# Patient Record
Sex: Female | Born: 1955
Health system: Southern US, Community
[De-identification: ages and names within clinical notes are randomized; demographics above are authoritative.]

## PROBLEM LIST (undated history)

## (undated) DIAGNOSIS — I10 Essential (primary) hypertension: Secondary | ICD-10-CM

## (undated) DIAGNOSIS — D649 Anemia, unspecified: Secondary | ICD-10-CM

## (undated) DIAGNOSIS — E785 Hyperlipidemia, unspecified: Secondary | ICD-10-CM

## (undated) DIAGNOSIS — K648 Other hemorrhoids: Secondary | ICD-10-CM

## (undated) HISTORY — DX: Essential (primary) hypertension: I10

## (undated) HISTORY — DX: Other hemorrhoids: K64.8

## (undated) HISTORY — DX: Anemia, unspecified: D64.9

## (undated) HISTORY — DX: Hyperlipidemia, unspecified: E78.5

---

## 2002-06-16 ENCOUNTER — Encounter: Admission: RE | Admit: 2002-06-16 | Discharge: 2002-06-16 | Payer: Self-pay | Admitting: Internal Medicine

## 2002-06-30 ENCOUNTER — Encounter: Admission: RE | Admit: 2002-06-30 | Discharge: 2002-06-30 | Payer: Self-pay | Admitting: Internal Medicine

## 2002-09-10 ENCOUNTER — Encounter: Admission: RE | Admit: 2002-09-10 | Discharge: 2002-09-10 | Payer: Self-pay | Admitting: Internal Medicine

## 2002-10-07 ENCOUNTER — Encounter: Admission: RE | Admit: 2002-10-07 | Discharge: 2002-10-07 | Payer: Self-pay | Admitting: Internal Medicine

## 2002-12-31 ENCOUNTER — Encounter: Admission: RE | Admit: 2002-12-31 | Discharge: 2002-12-31 | Payer: Self-pay | Admitting: Internal Medicine

## 2003-10-26 ENCOUNTER — Encounter: Admission: RE | Admit: 2003-10-26 | Discharge: 2003-10-26 | Payer: Self-pay | Admitting: Internal Medicine

## 2005-08-09 ENCOUNTER — Ambulatory Visit: Payer: Self-pay | Admitting: Internal Medicine

## 2005-08-15 ENCOUNTER — Ambulatory Visit: Payer: Self-pay | Admitting: Internal Medicine

## 2005-09-05 ENCOUNTER — Ambulatory Visit: Payer: Self-pay | Admitting: Internal Medicine

## 2006-08-26 DIAGNOSIS — D649 Anemia, unspecified: Secondary | ICD-10-CM

## 2006-08-26 HISTORY — DX: Anemia, unspecified: D64.9

## 2006-09-25 ENCOUNTER — Telehealth: Payer: Self-pay | Admitting: *Deleted

## 2006-09-25 DIAGNOSIS — I10 Essential (primary) hypertension: Secondary | ICD-10-CM

## 2006-09-26 ENCOUNTER — Ambulatory Visit: Payer: Self-pay | Admitting: Internal Medicine

## 2006-09-26 ENCOUNTER — Encounter: Payer: Self-pay | Admitting: Internal Medicine

## 2006-09-26 LAB — CONVERTED CEMR LAB
Calcium: 9.7 mg/dL (ref 8.4–10.5)
Creatinine, Ser: 1.09 mg/dL (ref 0.40–1.20)

## 2006-10-09 ENCOUNTER — Telehealth (INDEPENDENT_AMBULATORY_CARE_PROVIDER_SITE_OTHER): Payer: Self-pay | Admitting: *Deleted

## 2006-10-22 ENCOUNTER — Telehealth (INDEPENDENT_AMBULATORY_CARE_PROVIDER_SITE_OTHER): Payer: Self-pay | Admitting: *Deleted

## 2007-02-16 ENCOUNTER — Telehealth: Payer: Self-pay | Admitting: *Deleted

## 2007-02-19 ENCOUNTER — Ambulatory Visit: Payer: Self-pay | Admitting: Internal Medicine

## 2007-02-19 ENCOUNTER — Encounter (INDEPENDENT_AMBULATORY_CARE_PROVIDER_SITE_OTHER): Payer: Self-pay | Admitting: Internal Medicine

## 2007-02-20 ENCOUNTER — Encounter (INDEPENDENT_AMBULATORY_CARE_PROVIDER_SITE_OTHER): Payer: Self-pay | Admitting: Internal Medicine

## 2007-02-20 DIAGNOSIS — D649 Anemia, unspecified: Secondary | ICD-10-CM

## 2007-02-20 LAB — CONVERTED CEMR LAB
ALT: 17 units/L (ref 0–35)
AST: 24 units/L (ref 0–37)
Albumin: 4.1 g/dL (ref 3.5–5.2)
BUN: 9 mg/dL (ref 6–23)
CO2: 25 meq/L (ref 19–32)
Chloride: 107 meq/L (ref 96–112)
Creatinine, Ser: 1.04 mg/dL (ref 0.40–1.20)
HCT: 31.6 % — ABNORMAL LOW (ref 36.0–46.0)
HDL: 58 mg/dL (ref >39)
Hemoglobin: 9.4 g/dL — ABNORMAL LOW (ref 12.0–15.0)
MCHC: 29.7 g/dL — ABNORMAL LOW (ref 30.0–36.0)
RDW: 19.1 % — ABNORMAL HIGH (ref 11.5–14.0)
Total Protein: 7.5 g/dL (ref 6.0–8.3)
VLDL: 16 mg/dL (ref 0–40)
WBC: 7 10*3/uL (ref 4.0–10.5)

## 2007-03-05 ENCOUNTER — Ambulatory Visit: Payer: Self-pay | Admitting: Internal Medicine

## 2007-03-05 ENCOUNTER — Encounter: Payer: Self-pay | Admitting: Internal Medicine

## 2007-03-05 DIAGNOSIS — E785 Hyperlipidemia, unspecified: Secondary | ICD-10-CM

## 2007-03-06 LAB — CONVERTED CEMR LAB
Basophils Absolute: 0 10*3/uL (ref 0.0–0.1)
Eosinophils Absolute: 0 10*3/uL (ref 0.0–0.7)
Eosinophils Relative: 0 % (ref 0–5)
Free T4: 1.14 ng/dL (ref 0.89–1.80)
HCT: 32.4 % — ABNORMAL LOW (ref 36.0–46.0)
Lymphs Abs: 1.3 10*3/uL (ref 0.7–3.3)
MCHC: 29.6 g/dL — ABNORMAL LOW (ref 30.0–36.0)
Platelets: 261 10*3/uL (ref 150–400)
RDW: 17.7 % — ABNORMAL HIGH (ref 11.5–14.0)
TSH: 1.402 microintl units/mL (ref 0.350–5.50)
WBC: 6.3 10*3/uL (ref 4.0–10.5)

## 2007-04-02 ENCOUNTER — Ambulatory Visit: Payer: Self-pay | Admitting: Internal Medicine

## 2007-04-02 ENCOUNTER — Encounter (INDEPENDENT_AMBULATORY_CARE_PROVIDER_SITE_OTHER): Payer: Self-pay | Admitting: Internal Medicine

## 2007-04-02 LAB — CONVERTED CEMR LAB
Ferritin: 20 ng/mL (ref 10–291)
Iron: 68 ug/dL (ref 42–145)
RBC Folate: 624 ng/mL — ABNORMAL HIGH (ref 180–600)
Saturation Ratios: 21 % (ref 20–55)
TIBC: 321 ug/dL (ref 250–470)
UIBC: 253 ug/dL

## 2007-04-09 LAB — CONVERTED CEMR LAB: OCCULT 2: NEGATIVE

## 2007-04-13 ENCOUNTER — Ambulatory Visit: Payer: Self-pay | Admitting: *Deleted

## 2007-05-13 ENCOUNTER — Ambulatory Visit: Payer: Self-pay | Admitting: Internal Medicine

## 2007-05-13 ENCOUNTER — Encounter (INDEPENDENT_AMBULATORY_CARE_PROVIDER_SITE_OTHER): Payer: Self-pay | Admitting: Internal Medicine

## 2007-05-13 LAB — CONVERTED CEMR LAB
BUN: 18 mg/dL (ref 6–23)
CO2: 26 meq/L (ref 19–32)
Calcium: 9.2 mg/dL (ref 8.4–10.5)
Chloride: 103 meq/L (ref 96–112)
Creatinine, Ser: 0.96 mg/dL (ref 0.40–1.20)
Glucose, Bld: 96 mg/dL (ref 70–99)
HCT: 39.8 % (ref 36.0–46.0)
Hemoglobin: 12.5 g/dL (ref 12.0–15.0)
MCHC: 31.4 g/dL (ref 30.0–36.0)
MCV: 82.7 fL (ref 78.0–100.0)
Platelets: 215 10*3/uL (ref 150–400)
Potassium: 4.1 meq/L (ref 3.5–5.3)
RBC: 4.81 M/uL (ref 3.87–5.11)
RDW: 20.2 % — ABNORMAL HIGH (ref 11.5–14.0)
Sodium: 140 meq/L (ref 135–145)
WBC: 3.9 10*3/uL — ABNORMAL LOW (ref 4.0–10.5)

## 2007-05-15 ENCOUNTER — Encounter (INDEPENDENT_AMBULATORY_CARE_PROVIDER_SITE_OTHER): Payer: Self-pay | Admitting: Internal Medicine

## 2007-05-22 ENCOUNTER — Ambulatory Visit (HOSPITAL_COMMUNITY): Admission: RE | Admit: 2007-05-22 | Discharge: 2007-05-22 | Payer: Self-pay | Admitting: Internal Medicine

## 2007-06-05 ENCOUNTER — Encounter (INDEPENDENT_AMBULATORY_CARE_PROVIDER_SITE_OTHER): Payer: Self-pay | Admitting: Internal Medicine

## 2007-06-05 LAB — HM MAMMOGRAPHY: HM Mammogram: NORMAL

## 2007-08-16 ENCOUNTER — Emergency Department (HOSPITAL_COMMUNITY): Admission: EM | Admit: 2007-08-16 | Discharge: 2007-08-16 | Payer: Self-pay | Admitting: Emergency Medicine

## 2008-10-31 ENCOUNTER — Telehealth (INDEPENDENT_AMBULATORY_CARE_PROVIDER_SITE_OTHER): Payer: Self-pay | Admitting: *Deleted

## 2008-11-04 ENCOUNTER — Encounter: Payer: Self-pay | Admitting: Internal Medicine

## 2008-11-04 ENCOUNTER — Encounter (INDEPENDENT_AMBULATORY_CARE_PROVIDER_SITE_OTHER): Payer: Self-pay | Admitting: Internal Medicine

## 2008-11-04 ENCOUNTER — Ambulatory Visit: Payer: Self-pay | Admitting: Internal Medicine

## 2008-11-04 LAB — CONVERTED CEMR LAB
ALT: 11 units/L (ref 0–35)
AST: 18 units/L (ref 0–37)
Albumin: 4.5 g/dL (ref 3.5–5.2)
Alkaline Phosphatase: 68 units/L (ref 39–117)
BUN: 16 mg/dL (ref 6–23)
CO2: 25 meq/L (ref 19–32)
Calcium: 9.6 mg/dL (ref 8.4–10.5)
Chloride: 104 meq/L (ref 96–112)
Creatinine, Ser: 1.08 mg/dL (ref 0.40–1.20)
MCHC: 31.2 g/dL (ref 30.0–36.0)
MCV: 86.6 fL (ref 78.0–100.0)
Platelets: 232 10*3/uL (ref 150–400)
Potassium: 4.3 meq/L (ref 3.5–5.3)
Total Protein: 7.7 g/dL (ref 6.0–8.3)

## 2008-11-09 ENCOUNTER — Encounter (INDEPENDENT_AMBULATORY_CARE_PROVIDER_SITE_OTHER): Payer: Self-pay | Admitting: Internal Medicine

## 2008-11-09 ENCOUNTER — Ambulatory Visit: Payer: Self-pay | Admitting: *Deleted

## 2008-11-10 ENCOUNTER — Telehealth: Payer: Self-pay | Admitting: *Deleted

## 2008-11-10 LAB — CONVERTED CEMR LAB
HDL: 60 mg/dL (ref >39)
VLDL: 11 mg/dL (ref 0–40)

## 2008-12-23 ENCOUNTER — Ambulatory Visit: Payer: Self-pay | Admitting: Internal Medicine

## 2008-12-23 ENCOUNTER — Encounter: Payer: Self-pay | Admitting: Internal Medicine

## 2008-12-23 LAB — HM PAP SMEAR: HM Pap smear: NEGATIVE

## 2008-12-29 ENCOUNTER — Ambulatory Visit: Payer: Self-pay | Admitting: Internal Medicine

## 2009-01-02 DIAGNOSIS — K921 Melena: Secondary | ICD-10-CM | POA: Insufficient documentation

## 2009-01-02 LAB — CONVERTED CEMR LAB
OCCULT 1: NEGATIVE
OCCULT 2: NEGATIVE

## 2009-12-05 ENCOUNTER — Telehealth: Payer: Self-pay | Admitting: Internal Medicine

## 2009-12-27 ENCOUNTER — Ambulatory Visit: Payer: Self-pay | Admitting: Internal Medicine

## 2009-12-27 DIAGNOSIS — K5900 Constipation, unspecified: Secondary | ICD-10-CM | POA: Insufficient documentation

## 2009-12-28 LAB — CONVERTED CEMR LAB
AST: 16 units/L (ref 0–37)
Albumin: 4.6 g/dL (ref 3.5–5.2)
BUN: 17 mg/dL (ref 6–23)
Calcium: 10.2 mg/dL (ref 8.4–10.5)
Glucose, Bld: 105 mg/dL — ABNORMAL HIGH (ref 70–99)
HCT: 41.5 % (ref 36.0–46.0)
Platelets: 234 10*3/uL (ref 150–400)
Potassium: 4.4 meq/L (ref 3.5–5.3)
Sodium: 139 meq/L (ref 135–145)
Total Protein: 8.3 g/dL (ref 6.0–8.3)
Triglycerides: 80 mg/dL (ref <150)
VLDL: 16 mg/dL (ref 0–40)
WBC: 4.9 10*3/uL (ref 4.0–10.5)

## 2010-01-03 ENCOUNTER — Ambulatory Visit: Payer: Self-pay | Admitting: Internal Medicine

## 2010-01-03 LAB — CONVERTED CEMR LAB: OCCULT 2: NEGATIVE

## 2010-01-24 ENCOUNTER — Inpatient Hospital Stay (HOSPITAL_COMMUNITY): Admission: EM | Admit: 2010-01-24 | Discharge: 2010-01-29 | Payer: Self-pay | Admitting: Emergency Medicine

## 2010-01-24 ENCOUNTER — Encounter: Payer: Self-pay | Admitting: Internal Medicine

## 2010-01-24 HISTORY — PX: ORIF TIBIA PLATEAU: SHX2132

## 2010-09-25 NOTE — Progress Notes (Signed)
Summary: Refill/gh  Phone Note Refill Request Message from:  Fax from Pharmacy on December 05, 2009 2:46 PM  Refills Requested: Medication #1:  LISINOPRIL 40 MG TABS Take 1 tablet by mouth once a day   Last Refilled: 11/03/2009  Medication #2:  HYDROCHLOROTHIAZIDE 25 MG TABS Take 1 tablet by mouth once a day   Last Refilled: 11/03/2009  Method Requested: Electronic Initial call taken by: Angelina Ok RN,  December 05, 2009 2:46 PM  Follow-up for Phone Call       Follow-up by: Blondell Reveal MD,  December 06, 2009 11:32 AM    Prescriptions: LISINOPRIL 40 MG TABS (LISINOPRIL) Take 1 tablet by mouth once a day  #30 x 11   Entered and Authorized by:   Blondell Reveal MD   Signed by:   Blondell Reveal MD on 12/06/2009   Method used:   Electronically to        [facility] #[id number]* (retail)       [address] [place], [place]  [address]       Ph: [phone]       Fax: [phone]   RxID:   [id number] HYDROCHLOROTHIAZIDE 25 MG TABS (HYDROCHLOROTHIAZIDE) Take 1 tablet by mouth once a day  #30 x 11   Entered and Authorized by:   Blondell Reveal MD   Signed by:   Blondell Reveal MD on 12/06/2009   Method used:   Electronically to        [facility] #[id number]* (retail)       [address] [place], [place]  [address]       Ph: [phone]       Fax: [phone]   RxID:   [id number]

## 2010-09-25 NOTE — Assessment & Plan Note (Signed)
Summary: est-ck/fu/meds/cfb   Vital Signs:  Patient profile:   55 year old female Height:      61 inches (154.94 cm) Weight:      171.0 pounds (77.73 kg) BMI:     32.43 Temp:     97.8 degrees F (36.56 degrees C) oral Pulse rate:   77 / minute BP sitting:   121 / 76  (right arm) Cuff size:   regular  Vitals Entered By: Theotis Barrio NT II (Dec 27, 2009 9:53 AM) CC: MEDICATION REFILL / ROUTINE OFFICE VISIT   / NEEDS SOMETHING TO HELP WITH BM- Is Patient Diabetic? No Pain Assessment Patient in pain? no      Nutritional Status BMI of > 30 = obese  Have you ever been in a relationship where you felt threatened, hurt or afraid?No   Does patient need assistance? Functional Status Self care Ambulation Normal Comments NEEDS SOMETHING TO HELP WITH BM  /ROUTINE OFFICE VISIT /  MEDICATION REFILL   Primary Care Provider:  Blondell Reveal MD  CC:  MEDICATION REFILL / ROUTINE OFFICE VISIT   / NEEDS SOMETHING TO HELP WITH BM-.  History of Present Illness: 55  y/o woman with PMH of  HTN, HLD comes to the office for a regular office visit. Patient reports hard stools recently for the last few months. Intermittent symptoms. She does have regular BM but reports hard stools. She denies any blood in the stool.   She denies any other complaints.  Preventive Screening-Counseling & Management  Alcohol-Tobacco     Alcohol type: wine occasioally     Smoking Status: never  Caffeine-Diet-Exercise     Does Patient Exercise: yes     Type of exercise: WALKING     Times/week: 3  Problems Prior to Update: 1)  Hemoccult Positive Stool  (ICD-578.1) 2)  Preventive Health Care  (ICD-V70.0) 3)  Hyperlipidemia  (ICD-272.4) 4)  Anemia Nos  (ICD-285.9) 5)  Health Maintenance Exam  (ICD-V70.0) 6)  Examination, Eyes/vision  (ICD-V72.0) 7)  Hypertension  (ICD-401.9)  Medications Prior to Update: 1)  Hydrochlorothiazide 25 Mg Tabs (Hydrochlorothiazide) .... Take 1 Tablet By Mouth Once A Day 2)   Lisinopril 40 Mg Tabs (Lisinopril) .... Take 1 Tablet By Mouth Once A Day 3)  Slow Fe 160 (50 Fe) Mg  Tbcr (Ferrous Sulfate Dried) .... Take 1 Tablet By Mouth Three Times A Day 4)  Pravastatin Sodium 40 Mg Tabs (Pravastatin Sodium) .... Take 1 Tablet By Mouth Once A Day  Current Medications (verified): 1)  Hydrochlorothiazide 25 Mg Tabs (Hydrochlorothiazide) .... Take 1 Tablet By Mouth Once A Day 2)  Lisinopril 40 Mg Tabs (Lisinopril) .... Take 1 Tablet By Mouth Once A Day 3)  Slow Fe 160 (50 Fe) Mg  Tbcr (Ferrous Sulfate Dried) .... Take 1 Tablet By Mouth Three Times A Day 4)  Pravastatin Sodium 40 Mg Tabs (Pravastatin Sodium) .... Take 1 Tablet By Mouth Once A Day  Allergies (verified): No Known Drug Allergies  Past History:  Family History: Last updated: 03/05/2007 Family History High cholesterol Family History Hypertension  Risk Factors: Exercise: yes (12/27/2009)  Past Medical History: Reviewed history from 03/05/2007 and no changes required. Hypertension Hyperlipidemia Anemia  Family History: Reviewed history from 03/05/2007 and no changes required. Family History High cholesterol Family History Hypertension  Social History: Reviewed history and no changes required. 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   Lungs:  Normal respiratory effort, chest expands symmetrically. Lungs are clear to auscultation, no crackles or wheezes. Heart:  Normal rate and regular rhythm. S1 and S2 normal without gallop, murmur, click, rub or other extra sounds. Abdomen:  Bowel sounds positive,abdomen soft and non-tender without masses, organomegaly or hernias noted. Pulses:  R radial normal.   Extremities:  no  edema Neurologic:  alert & oriented X3, strength normal in all extremities, and gait normal.     Impression & Recommendations:  Problem # 1:  HYPERLIPIDEMIA (ICD-272.4)  Will check FLP today. Cont statins for now.  Her updated medication list for this problem includes:    Pravastatin Sodium 40 Mg Tabs (Pravastatin sodium) .Marland Kitchen... Take 1 tablet by mouth once a day  Orders: T-Hgb A1C (in-house) ([id number]) T-Comprehensive Metabolic Panel ([id number]) T-CBC No Diff ([id number]) T-Hemoccult Card-Multiple (take home) (73220) T-Lipid Profile (25427-06237)  Problem # 2:  ANEMIA NOS (ICD-285.9)  Hb was improving. Given her age and history of FOBT+, will refer to GI for colonoscopy.  Her updated medication list for this problem includes:    Slow Fe 160 (50 Fe) Mg Tbcr (Ferrous sulfate dried) .Marland Kitchen... Take 1 tablet by mouth three times a day  Orders: T-Hgb A1C (in-house) ([id number]) T-Comprehensive Metabolic Panel ([id number]) T-CBC No Diff ([id number]) T-Hemoccult Card-Multiple (take home) (70350) T-Lipid Profile (09381-82993)  Problem # 3:  HYPERTENSION (ICD-401.9)  BP well controlled. Continue current regimen.  Her updated medication list for this problem includes:    Hydrochlorothiazide 25 Mg Tabs (Hydrochlorothiazide) .Marland Kitchen... Take 1 tablet by mouth once a day    Lisinopril 40 Mg Tabs (Lisinopril) .Marland Kitchen... Take 1 tablet by mouth once a day  Orders: T-Hgb A1C (in-house) ([id number]) T-Comprehensive Metabolic Panel ([id number]) T-CBC No Diff ([id number]) T-Hemoccult Card-Multiple (take home) (23536) T-Lipid Profile (14431-54008)  Problem # 4:  PREVENTIVE HEALTH CARE (ICD-V70.0) Will get a Screening Colonoscopy.  Orders: Gastroenterology Referral (GI) T-Hgb A1C (in-house) ([id number]) T-Comprehensive Metabolic Panel [phone]) T-CBC No Diff ([id number]) T-Hemoccult Card-Multiple (take home) (53976) T-Lipid Profile (73419-37902)  Problem # 5:  CONSTIPATION, INTERMITTENT  (ICD-564.00)  C/O Hard stools and occasional constipation. Recommended life style modification, exercise, increased water intake, fruits and vegetables. Will start her on Colace.   Her updated medication list for this problem includes:    Docusate Sodium 100 Mg Caps (Docusate sodium) .Marland Kitchen... Take 1 tablet by mouth two times a day as needed for constipation.  Complete Medication List: 1)  Hydrochlorothiazide 25 Mg Tabs (Hydrochlorothiazide) .... Take 1 tablet by mouth once a day 2)  Lisinopril 40 Mg Tabs (Lisinopril) .... Take 1 tablet by mouth once a day 3)  Slow Fe 160 (50 Fe) Mg Tbcr (Ferrous sulfate dried) .... Take 1 tablet by mouth three times a day 4)  Pravastatin Sodium 40 Mg Tabs (Pravastatin sodium) .... Take 1 tablet by mouth once a day 5)  Docusate Sodium 100 Mg Caps (Docusate sodium) .... Take 1 tablet by mouth two times a day as needed for constipation.  Patient Instructions: 1)  Please schedule a follow-up appointment in 6 months. 2)  Take all the medications as advised below. 3)  We will schedule you for colonoscopy and will inform you. Prescriptions: SLOW FE 160 (50 FE) MG  TBCR (FERROUS SULFATE DRIED) Take 1 tablet by mouth three times a day  #90 x 6   Entered and  Authorized by:   Blondell Reveal MD   Signed by:   Blondell Reveal MD on 12/27/2009   Method used:   Print then Give to Patient   RxID:   [id number] LISINOPRIL 40 MG TABS (LISINOPRIL) Take 1 tablet by mouth once a day  #30 x 11   Entered and Authorized by:   Blondell Reveal MD   Signed by:   Blondell Reveal MD on 12/27/2009   Method used:   Print then Give to Patient   RxID:   [id number] HYDROCHLOROTHIAZIDE 25 MG TABS (HYDROCHLOROTHIAZIDE) Take 1 tablet by mouth once a day  #30 x 11   Entered and Authorized by:   Blondell Reveal MD   Signed by:   Blondell Reveal MD on 12/27/2009   Method used:   Print then Give to Patient   RxID:   [id number] DOCUSATE SODIUM 100 MG CAPS (DOCUSATE SODIUM) Take 1 tablet  by mouth two times a day as needed for constipation.  #60 x 2   Entered and Authorized by:   Blondell Reveal MD   Signed by:   Blondell Reveal MD on 12/27/2009   Method used:   Print then Give to Patient   RxID:   [id number]  Process Orders Check Orders Results:     [facility]: ABN not required for this insurance Tests Sent for requisitioning (Dec 27, 2009 10:56 AM):     12/27/2009: [facility] -- T-Comprehensive Metabolic Panel [[id number]] (signed)     12/27/2009: [facility] -- T-CBC No Diff [[id number]] (signed)     12/27/2009: [facility] -- T-Lipid Profile [phone] (signed)    Prevention & Chronic Care Immunizations   Influenza vaccine: Not documented   Influenza vaccine deferral: Not indicated  (12/27/2009)    Tetanus booster: Not documented   Td booster deferral: Refused  (12/27/2009)    Pneumococcal vaccine: Not documented  Colorectal Screening   Hemoccult: Not documented    Colonoscopy: Not documented  Other Screening   Pap smear: NEGATIVE FOR INTRAEPITHELIAL LESIONS OR MALIGNANCY.  (12/23/2008)   Pap smear action/deferral: Deferred-2 yr interval  (12/27/2009)   Pap smear due: 05/2008    Mammogram: normal  (06/05/2007)   Mammogram action/deferral: Refused  (12/27/2009)   Mammogram due: 05/2008   Smoking status: never  (12/27/2009)    Screening comments: patient deferred mammogram to some other time  Lipids   Total Cholesterol: 264  (11/09/2008)   LDL: 193  (11/09/2008)   LDL Direct: Not documented   HDL: 60  (11/09/2008)   Triglycerides: 56  (11/09/2008)    SGOT (AST): 18  (11/04/2008)   SGPT (ALT): 11  (11/04/2008) CMP ordered    Alkaline phosphatase: 68  (11/04/2008)   Total bilirubin: 0.3  (11/04/2008)  Hypertension   Last Blood Pressure: 121 / 76  (12/27/2009)   Serum creatinine: 1.08  (11/04/2008)   Serum potassium 4.3  (11/04/2008) CMP ordered   Self-Management  Support :   Personal Goals (by the next clinic visit) :      Personal blood pressure goal: 140/90  (12/27/2009)     Personal LDL goal: 100  (12/27/2009)    Patient will work on the following items until the next clinic visit to reach self-care goals:     Medications and monitoring: take my medicines every day, bring all of my medications to every visit  (12/27/2009)     Eating: drink diet soda or water instead of juice or soda, eat more vegetables, use  fresh or frozen vegetables, eat foods that are low in salt, eat baked foods instead of fried foods, eat fruit for snacks and desserts, limit or avoid alcohol  (12/27/2009)     Activity: take a 30 minute walk every day  (12/27/2009)    Hypertension self-management support: Resources for patients handout  (12/27/2009)    Lipid self-management support: Resources for patients handout  (12/27/2009)        Resource handout printed.  Appended Document: Results of HGB A1C    Lab Visit  Laboratory Results   Blood Tests   Date/Time Received: Dec 27, 2009 11:52 AM  Date/Time Reported: Burke Keels  Dec 27, 2009 11:52 AM   HGBA1C: 5.4%   (Normal Range: Non-Diabetic - 3-6%   Control Diabetic - 6-8%)    Orders Today:

## 2010-09-25 NOTE — Miscellaneous (Signed)
Summary: ER visit.  Patient come the ER after sustaining an injury on her right knee after falling when running. She denies any CP, palpitations, dizziness, fever, chills or any other complaints that could contribute to her fall. She just trip and fall. Patient had a x-ray showing a depressed medial fracture of her right tibial plateau; she is going to follow by orthopedics on Friday for this fracture and will received imbolization of her knee meanwhile.  Once in the ED patient CR was up to 1.5 initially and her BP was soft; her orthostatic vital signs were negative. she was rehydrated , subsequent Cr was back to normal to 1.04; BP meds has been hold until next monday, when she is going to be evaluated at the clinic for followup. She will need at BMET to follow on her renal function and will also need to have her BPmeds restarted/adjusted as needed.

## 2010-11-04 ENCOUNTER — Encounter: Payer: Self-pay | Admitting: Ophthalmology

## 2010-11-12 LAB — BASIC METABOLIC PANEL
CO2: 25 mEq/L (ref 19–32)
Calcium: 8.7 mg/dL (ref 8.4–10.5)
GFR calc Af Amer: >60 mL/min (ref >60)
GFR calc non Af Amer: 56 mL/min — ABNORMAL LOW (ref >60)
GFR calc non Af Amer: >60 mL/min (ref >60)
Potassium: 3.2 mEq/L — ABNORMAL LOW (ref 3.5–5.1)
Sodium: 138 mEq/L (ref 135–145)
Sodium: 138 mEq/L (ref 135–145)

## 2010-11-12 LAB — CBC
HCT: 32.4 % — ABNORMAL LOW (ref 36.0–46.0)
Hemoglobin: 11.3 g/dL — ABNORMAL LOW (ref 12.0–15.0)
Hemoglobin: 12.5 g/dL (ref 12.0–15.0)
MCV: 85.3 fL (ref 78.0–100.0)
Platelets: 164 10*3/uL (ref 150–400)
RBC: 3.8 MIL/uL — ABNORMAL LOW (ref 3.87–5.11)
WBC: 5.8 10*3/uL (ref 4.0–10.5)
WBC: 6.2 10*3/uL (ref 4.0–10.5)

## 2010-11-12 LAB — URINE CULTURE

## 2010-11-12 LAB — DIFFERENTIAL
Eosinophils Relative: 0 % (ref 0–5)
Lymphocytes Relative: 28 % (ref 12–46)
Lymphs Abs: 1.7 10*3/uL (ref 0.7–4.0)
Monocytes Absolute: 0.6 10*3/uL (ref 0.1–1.0)
Monocytes Relative: 9 % (ref 3–12)
Neutro Abs: 3.9 10*3/uL (ref 1.7–7.7)

## 2010-11-12 LAB — POCT I-STAT, CHEM 8
Creatinine, Ser: 1.5 mg/dL — ABNORMAL HIGH (ref 0.4–1.2)
HCT: 40 % (ref 36.0–46.0)
Potassium: 4 mEq/L (ref 3.5–5.1)
TCO2: 23 mmol/L (ref 0–100)

## 2010-11-12 LAB — URINALYSIS, ROUTINE W REFLEX MICROSCOPIC
Bilirubin Urine: NEGATIVE
Ketones, ur: NEGATIVE mg/dL
Nitrite: POSITIVE — AB
Specific Gravity, Urine: 1.018 (ref 1.005–1.030)
Urobilinogen, UA: 1 mg/dL (ref 0.0–1.0)

## 2010-11-12 LAB — URINE MICROSCOPIC-ADD ON

## 2010-11-20 ENCOUNTER — Other Ambulatory Visit: Payer: Self-pay | Admitting: *Deleted

## 2010-11-20 MED ORDER — PRAVASTATIN SODIUM 40 MG PO TABS: 40.0000 mg | ORAL_TABLET | Freq: Every day | ORAL | Status: DC

## 2010-11-20 NOTE — Telephone Encounter (Signed)
Last seen 12/27/09. Sent to front desk to sch routine F/U

## 2010-11-20 NOTE — Telephone Encounter (Signed)
No pharmacy.  How do I refill this?

## 2010-12-06 ENCOUNTER — Other Ambulatory Visit (HOSPITAL_COMMUNITY)
Admission: RE | Admit: 2010-12-06 | Discharge: 2010-12-06 | Disposition: A | Discharge status: Home / Self Care | Payer: Self-pay | Source: Ambulatory Visit | Attending: Internal Medicine | Admitting: Internal Medicine

## 2010-12-06 ENCOUNTER — Ambulatory Visit (INDEPENDENT_AMBULATORY_CARE_PROVIDER_SITE_OTHER): Payer: Self-pay | Admitting: Internal Medicine

## 2010-12-06 ENCOUNTER — Encounter: Payer: Self-pay | Admitting: Internal Medicine

## 2010-12-06 VITALS — BP 138/83 | HR 71 | Temp 97.2°F | Ht 63.0 in | Wt 173.9 lb

## 2010-12-06 DIAGNOSIS — E785 Hyperlipidemia, unspecified: Secondary | ICD-10-CM

## 2010-12-06 DIAGNOSIS — Z01419 Encounter for gynecological examination (general) (routine) without abnormal findings: Secondary | ICD-10-CM | POA: Insufficient documentation

## 2010-12-06 DIAGNOSIS — Z124 Encounter for screening for malignant neoplasm of cervix: Secondary | ICD-10-CM

## 2010-12-06 DIAGNOSIS — Z0000 Encounter for general adult medical examination without abnormal findings: Secondary | ICD-10-CM

## 2010-12-06 DIAGNOSIS — I10 Essential (primary) hypertension: Secondary | ICD-10-CM

## 2010-12-06 DIAGNOSIS — D649 Anemia, unspecified: Secondary | ICD-10-CM

## 2010-12-06 DIAGNOSIS — N898 Other specified noninflammatory disorders of vagina: Secondary | ICD-10-CM

## 2010-12-06 NOTE — Patient Instructions (Addendum)
1) Please follow-up at the clinic in 1 month, at which time we will reevaluate your blood pressure, cholesterol. 2) Please bring all of your medications in a bag to your next visit. 3) Please come back for labs next week, please be fasting (no food/ drink x 8 hours).

## 2010-12-07 ENCOUNTER — Telehealth: Payer: Self-pay | Admitting: Internal Medicine

## 2010-12-07 ENCOUNTER — Encounter: Payer: Self-pay | Admitting: Internal Medicine

## 2010-12-07 DIAGNOSIS — Z0000 Encounter for general adult medical examination without abnormal findings: Secondary | ICD-10-CM | POA: Insufficient documentation

## 2010-12-07 DIAGNOSIS — B9689 Other specified bacterial agents as the cause of diseases classified elsewhere: Secondary | ICD-10-CM | POA: Insufficient documentation

## 2010-12-07 DIAGNOSIS — N760 Acute vaginitis: Secondary | ICD-10-CM | POA: Insufficient documentation

## 2010-12-07 DIAGNOSIS — N898 Other specified noninflammatory disorders of vagina: Secondary | ICD-10-CM | POA: Insufficient documentation

## 2010-12-07 LAB — WET PREP BY MOLECULAR PROBE
Candida species: NEGATIVE
Trichomonas vaginosis: NEGATIVE

## 2010-12-07 MED ORDER — METRONIDAZOLE 500 MG PO TABS: 500.0000 mg | ORAL_TABLET | Freq: Two times a day (BID) | ORAL | Status: AC

## 2010-12-07 NOTE — Assessment & Plan Note (Signed)
Well controlled. Continue current regimen.  - Check CMET

## 2010-12-07 NOTE — Assessment & Plan Note (Addendum)
-   Needs mammogram - pt elects to follow up with [facility] to check current insurance status, aware she needs to get this done. - Needs Tdap - PAP today - Labs in 1 week when pt fasting., will do tdap at that time - Needs colonoscopy - pt elects to follow up with [facility] to check current insurance status, aware she needs to get this done.

## 2010-12-07 NOTE — Assessment & Plan Note (Signed)
PAP with wet prep, gc/ chlamydia today. - Will call if abn results.

## 2010-12-07 NOTE — Progress Notes (Signed)
  Subjective:    Patient ID: Miranda Griffin, female    DOB: 1956-08-02, 55 y.o.   MRN: [id number]  HPI Patient is a 55 y.o. female with a PMHx of HTN, HLD, anemia, who presents today for the following:   1) PAP smear - patient indicates that she is currently sexually active, with one female partner. Does not use contraception. Patient confirms recent increased mild discharge, without dysuria, hematuria, malodorous urine, no dyspareunia, fevers, chills, back pain. Does not douche. No external lesions, vaginal itching experienced.   2) HTN - Patient does not check blood pressure regularly at home. Currently taking Lisinopril 40mg  and HCTZ 25mg  daily. Denies missing doses. Denies headaches, dizziness, lightheadedness, chest pain, shortness of breath.   3) Preventative Health Care - Patient has history of FOBT (+), with multiple prior GI referrals. However, pt states she is unsure the status of her Medicaid, and is unsure where she stands in regards to [employer]. She is in need of colonoscopy. Denies recent noticeable blood in stools, weakness, lightheadedness. Patient also due for mammogram, which she again states she plans to get after insurance situation is clarified.   Review of Systems Per HPI.   Current Outpatient Medications Medication Sig  . hydrochlorothiazide 25 MG tablet Take 25 mg by mouth daily.    Marland Kitchen lisinopril (PRINIVIL,ZESTRIL) 40 MG tablet Take 40 mg by mouth daily.    . pravastatin (PRAVACHOL) 40 MG tablet Take 1 tablet (40 mg total) by mouth daily.   Allergies Review of patient's allergies indicates no known allergies.  Past Medical History  Diagnosis Date  . Hypertension   . Hyperlipidemia   . Anemia 2008    Now resolved, baseline- 12.5  . Heme positive stool 5/10    GI referral made, No note in EMR, most recent stool heme neg in  5/11    Past Surgical History  Procedure Date  . Knee surgery   . Cesarean section     x2, 1989, 1990       Objective:   Physical  Exam General: Vital signs reviewed and noted. Well-developed, well-nourished, in no acute distress; alert, appropriate and cooperative throughout examination.  Head: Normocephalic, atraumatic.  Neck: No deformities, masses, or tenderness noted.  Lungs:  Normal respiratory effort. Clear to auscultation BL without crackles or wheezes.  Heart: RRR. S1 and S2 normal without gallop, murmur, or rubs.  Abdomen:  BS normoactive. Soft, Nondistended, non-tender.  No masses or organomegaly.  Extremities: No pretibial edema.  Pelvic Exam: No external vaginal lesions appreciated. Vaginal vault with thin, creamy discharge, nonmalodorous. Cervix without noticeable lesions, nonfriable.No cervical motion tenderness.       Assessment & Plan:  Case and plan of care discussed with Dr. Doneen Poisson.

## 2010-12-07 NOTE — Assessment & Plan Note (Addendum)
Patient was called and informed that her recent wet prep showed that she has bacterial vaginosis, consistent with the discharge observed during exam and the discharge of which the patient complains. She will therefore, be started on Flagyl 500mg  BID x 7 days, which she is counseled not to talk with alcohol. She is instructed to complete all antibiotics as prescribed, denies prior adverse reaction to this medication. Instructed that if she has throat closing, rash, tongue swelling, stop medication immediately and return to clinic or to go to ER. - All questions were addressed, and patient is to call clinic if additional questions arise.

## 2010-12-07 NOTE — Telephone Encounter (Signed)
See A&P per BV problem above.

## 2010-12-07 NOTE — Assessment & Plan Note (Signed)
Check CMET, FLP - Pt to come next week for evaluation as she is not fasting today.

## 2010-12-07 NOTE — Assessment & Plan Note (Signed)
GI referral for colonoscopy once patient reestablishes insurance status.  - To meet with Rudell Cobb next week when she comes for labs.

## 2010-12-07 NOTE — Assessment & Plan Note (Signed)
-   Check CBC, ferritin, pt not on ferrous sulfate currently. - GI referral for colonoscopy.

## 2010-12-13 ENCOUNTER — Ambulatory Visit (INDEPENDENT_AMBULATORY_CARE_PROVIDER_SITE_OTHER): Payer: Self-pay | Admitting: *Deleted

## 2010-12-13 ENCOUNTER — Other Ambulatory Visit: Payer: Self-pay

## 2010-12-13 DIAGNOSIS — Z23 Encounter for immunization: Secondary | ICD-10-CM

## 2010-12-13 DIAGNOSIS — Z0000 Encounter for general adult medical examination without abnormal findings: Secondary | ICD-10-CM

## 2010-12-13 DIAGNOSIS — E785 Hyperlipidemia, unspecified: Secondary | ICD-10-CM

## 2010-12-13 DIAGNOSIS — D649 Anemia, unspecified: Secondary | ICD-10-CM

## 2010-12-13 DIAGNOSIS — I10 Essential (primary) hypertension: Secondary | ICD-10-CM

## 2010-12-13 LAB — COMPREHENSIVE METABOLIC PANEL
CO2: 27 mEq/L (ref 19–32)
Calcium: 10.1 mg/dL (ref 8.4–10.5)
Chloride: 103 mEq/L (ref 96–112)
Glucose, Bld: 102 mg/dL — ABNORMAL HIGH (ref 70–99)
Sodium: 139 mEq/L (ref 135–145)
Total Bilirubin: 0.3 mg/dL (ref 0.3–1.2)
Total Protein: 7.9 g/dL (ref 6.0–8.3)

## 2010-12-13 LAB — LIPID PANEL
Cholesterol: 192 mg/dL (ref 0–200)
Triglycerides: 48 mg/dL (ref <150)

## 2010-12-14 LAB — CBC
Hemoglobin: 12.2 g/dL (ref 12.0–15.0)
MCH: 27.7 pg (ref 26.0–34.0)
MCV: 86.8 fL (ref 78.0–100.0)
RBC: 4.4 MIL/uL (ref 3.87–5.11)

## 2010-12-20 ENCOUNTER — Other Ambulatory Visit: Payer: Self-pay

## 2011-02-19 ENCOUNTER — Other Ambulatory Visit: Payer: Self-pay | Admitting: Internal Medicine

## 2011-02-21 ENCOUNTER — Ambulatory Visit (INDEPENDENT_AMBULATORY_CARE_PROVIDER_SITE_OTHER): Payer: Self-pay | Admitting: Internal Medicine

## 2011-02-21 ENCOUNTER — Encounter: Payer: Self-pay | Admitting: Internal Medicine

## 2011-02-21 VITALS — BP 125/78 | HR 77 | Temp 98.0°F | Ht 63.0 in | Wt 173.1 lb

## 2011-02-21 DIAGNOSIS — B9689 Other specified bacterial agents as the cause of diseases classified elsewhere: Secondary | ICD-10-CM

## 2011-02-21 DIAGNOSIS — Z1231 Encounter for screening mammogram for malignant neoplasm of breast: Secondary | ICD-10-CM

## 2011-02-21 DIAGNOSIS — E785 Hyperlipidemia, unspecified: Secondary | ICD-10-CM

## 2011-02-21 DIAGNOSIS — N760 Acute vaginitis: Secondary | ICD-10-CM

## 2011-02-21 DIAGNOSIS — Z0000 Encounter for general adult medical examination without abnormal findings: Secondary | ICD-10-CM

## 2011-02-21 DIAGNOSIS — A499 Bacterial infection, unspecified: Secondary | ICD-10-CM

## 2011-02-21 DIAGNOSIS — I10 Essential (primary) hypertension: Secondary | ICD-10-CM

## 2011-02-21 NOTE — Patient Instructions (Signed)
   Please follow-up at the clinic in 3-6 months, at which time we will reevaluate your blood pressure.  If symptoms worsen, or new symptoms arise, please call the clinic or go to the ER.  We will set you up for a colonoscopy.  We will set you up for a mammogram.   Please bring all of your medications in a bag to your next visit.

## 2011-02-22 ENCOUNTER — Ambulatory Visit (HOSPITAL_COMMUNITY)
Admission: RE | Admit: 2011-02-22 | Discharge: 2011-02-22 | Disposition: A | Discharge status: Home / Self Care | Payer: Self-pay | Source: Ambulatory Visit | Attending: Internal Medicine | Admitting: Internal Medicine

## 2011-02-22 DIAGNOSIS — Z1231 Encounter for screening mammogram for malignant neoplasm of breast: Secondary | ICD-10-CM | POA: Insufficient documentation

## 2011-02-28 ENCOUNTER — Telehealth: Payer: Self-pay | Admitting: *Deleted

## 2011-02-28 NOTE — Telephone Encounter (Signed)
CALLED AND SPOKE WITH  Miranda Griffin, GAVE HER THE APPT INFO. FOR HER COLONOSCOPY.  [facility] - DR BRODIE - [id number] NURSE VISIT:JULY 12, 012 / PROCEDURE : July 26.012 2:30PM TO  ARRIVE 1:30PM. ALSO  TOLD PATIENT THAT A APPT REMINDER LETTER HAS BEEN MAILED OUT TO HER WITH THIS INFORMATION. Miranda Griffin NTII  7/4/012 / 8:44AM.

## 2011-03-07 ENCOUNTER — Encounter: Payer: Self-pay | Admitting: Internal Medicine

## 2011-03-07 NOTE — Assessment & Plan Note (Addendum)
Patient treated for bacterial vaginosis during last clinic visit, she has since had resolution of the vaginal discharge. I provided an extensive counseling to the patient regarding bacterial vaginosis specifically recommended using condoms regularly when engaging in sexual activity, avoiding douching, regularly cleansing and drying vaginal region.

## 2011-03-07 NOTE — Assessment & Plan Note (Signed)
-   Schedule for colonoscopy. - Schedule for mammogram.

## 2011-03-07 NOTE — Assessment & Plan Note (Signed)
Recent fasting lipid panel indicates LDL of 123, indicating adequate lipid management on current regimen. Well controlled. Continue current regimen.

## 2011-03-07 NOTE — Progress Notes (Signed)
  Subjective:    Patient ID: Miranda Griffin, female    DOB: 1955/10/27, 55 y.o.   MRN: [id number]  HPI Pt is a 55 y.o. female who has a past medical history of HTN; HLD; Anemia (2008) and presents to clinic today for the following:   1) HTN - Patient does not check blood pressure regularly at home. Currently taking Lisinopril 40mg  and HCTZ 25mg  daily. Denies missing doses. Denies headaches, dizziness, lightheadedness, chest pain, shortness of breath.   2) Follow-up of bacterial vaginosis - patient was noted to have bacterial vaginosis per wet prep in 11/2010, and was subsequently treated with Flagyl. Patient indicates that she completed therapy without incident. Has since remained without discharge, pruritis, vaginal lesions. No fevers, chills, dysuria, hematuria. Patient does have several questions today about how to avoid future episodes, and if it is safe to continue to be sexually active.   3) Preventative Health Care - Patient has history of FOBT (+), with multiple prior GI referrals. However, previously she was not following up as recommended because of lack of insurance. Has since qualified for orange card. She is in need of colonoscopy. Denies recent noticeable blood in stools, weakness, lightheadedness. Patient also due for mammogram.  4) HLD - currently taking pravastatin 40mg  daily. Had recent FLP showing LDL level of 123, therefore, no further adjustments were made to anti-lipid medications. Denies chest pain, difficulty breathing, palpitations, tachycardia, and muscle pains.     Review of Systems Per HPI.     Objective:   Physical Exam General: Vital signs reviewed and noted. Well-developed, well-nourished, in no acute distress; alert, appropriate and cooperative throughout examination.  Head: Normocephalic, atraumatic.  Neck: No deformities, masses, or tenderness noted.  Lungs:  Normal respiratory effort. Clear to auscultation BL without crackles or wheezes.  Heart: RRR. S1 and  S2 normal without gallop, murmur, or rubs.  Abdomen:  BS normoactive. Soft, Nondistended, non-tender.  No masses or organomegaly.  Extremities: No pretibial edema.         Assessment & Plan:  Case and plan of care discussed with Dr. Anderson Malta.

## 2011-03-07 NOTE — Assessment & Plan Note (Signed)
Well-controlled.  Continue current regimen. 

## 2011-03-08 ENCOUNTER — Ambulatory Visit (AMBULATORY_SURGERY_CENTER): Payer: Self-pay | Admitting: *Deleted

## 2011-03-08 VITALS — Ht 63.0 in | Wt 170.0 lb

## 2011-03-08 DIAGNOSIS — Z1211 Encounter for screening for malignant neoplasm of colon: Secondary | ICD-10-CM

## 2011-03-08 MED ORDER — PEG-KCL-NACL-NASULF-NA ASC-C 100 G PO SOLR: ORAL | Status: DC

## 2011-03-18 ENCOUNTER — Other Ambulatory Visit: Payer: Self-pay | Admitting: *Deleted

## 2011-03-18 MED ORDER — HYDROCHLOROTHIAZIDE 25 MG PO TABS: 25.0000 mg | ORAL_TABLET | Freq: Every day | ORAL | Status: DC

## 2011-03-18 MED ORDER — LISINOPRIL 40 MG PO TABS: 40.0000 mg | ORAL_TABLET | Freq: Every day | ORAL | Status: DC

## 2011-03-21 ENCOUNTER — Encounter: Payer: Self-pay | Admitting: Internal Medicine

## 2011-03-21 ENCOUNTER — Ambulatory Visit (AMBULATORY_SURGERY_CENTER): Payer: Self-pay | Admitting: Internal Medicine

## 2011-03-21 VITALS — BP 129/57 | HR 63 | Temp 97.7°F | Resp 14 | Ht 63.0 in | Wt 170.0 lb

## 2011-03-21 DIAGNOSIS — D649 Anemia, unspecified: Secondary | ICD-10-CM

## 2011-03-21 DIAGNOSIS — K648 Other hemorrhoids: Secondary | ICD-10-CM

## 2011-03-21 DIAGNOSIS — K921 Melena: Secondary | ICD-10-CM

## 2011-03-21 DIAGNOSIS — Z1211 Encounter for screening for malignant neoplasm of colon: Secondary | ICD-10-CM

## 2011-03-21 MED ORDER — SODIUM CHLORIDE 0.9 % IV SOLN: 500.0000 mL | INTRAVENOUS | Status: DC

## 2011-03-21 NOTE — Progress Notes (Signed)
Pt tolerated colon exam very well. MAW

## 2011-03-21 NOTE — Patient Instructions (Signed)
Discharge instructions given with verbal understanding.  Handouts on hemorrhoids and a high fiber diet given.  Resume previous medications. 

## 2011-03-22 ENCOUNTER — Telehealth: Payer: Self-pay

## 2011-03-22 NOTE — Telephone Encounter (Signed)

## 2011-05-05 IMAGING — CR DG CHEST 2V
2 series · 2 of 2 positions shown · non-contrast
Comparison: None

CLINICAL DATA: Dizziness, hypertension

CHEST - 2 VIEW

[w chest pa]
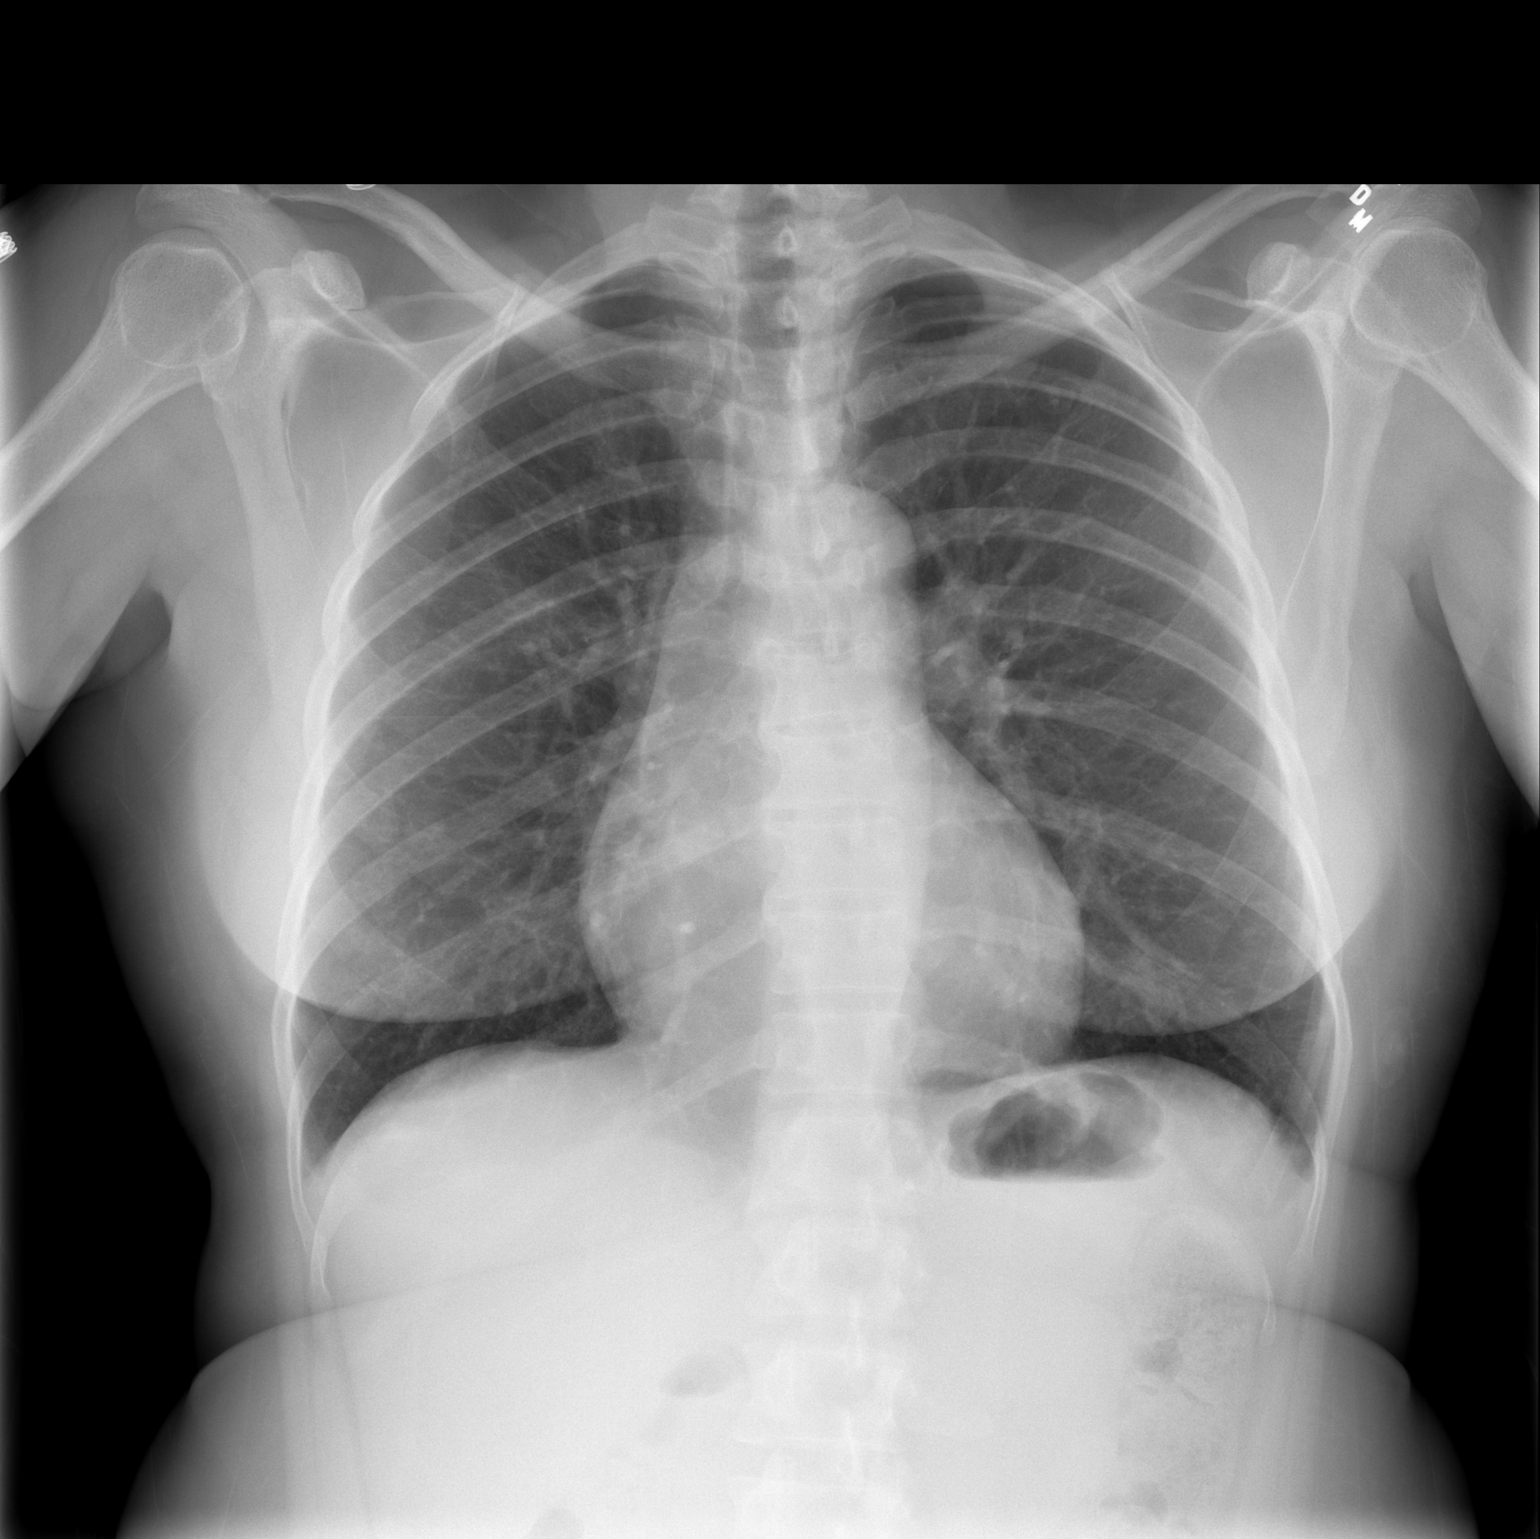

[w chest lat]
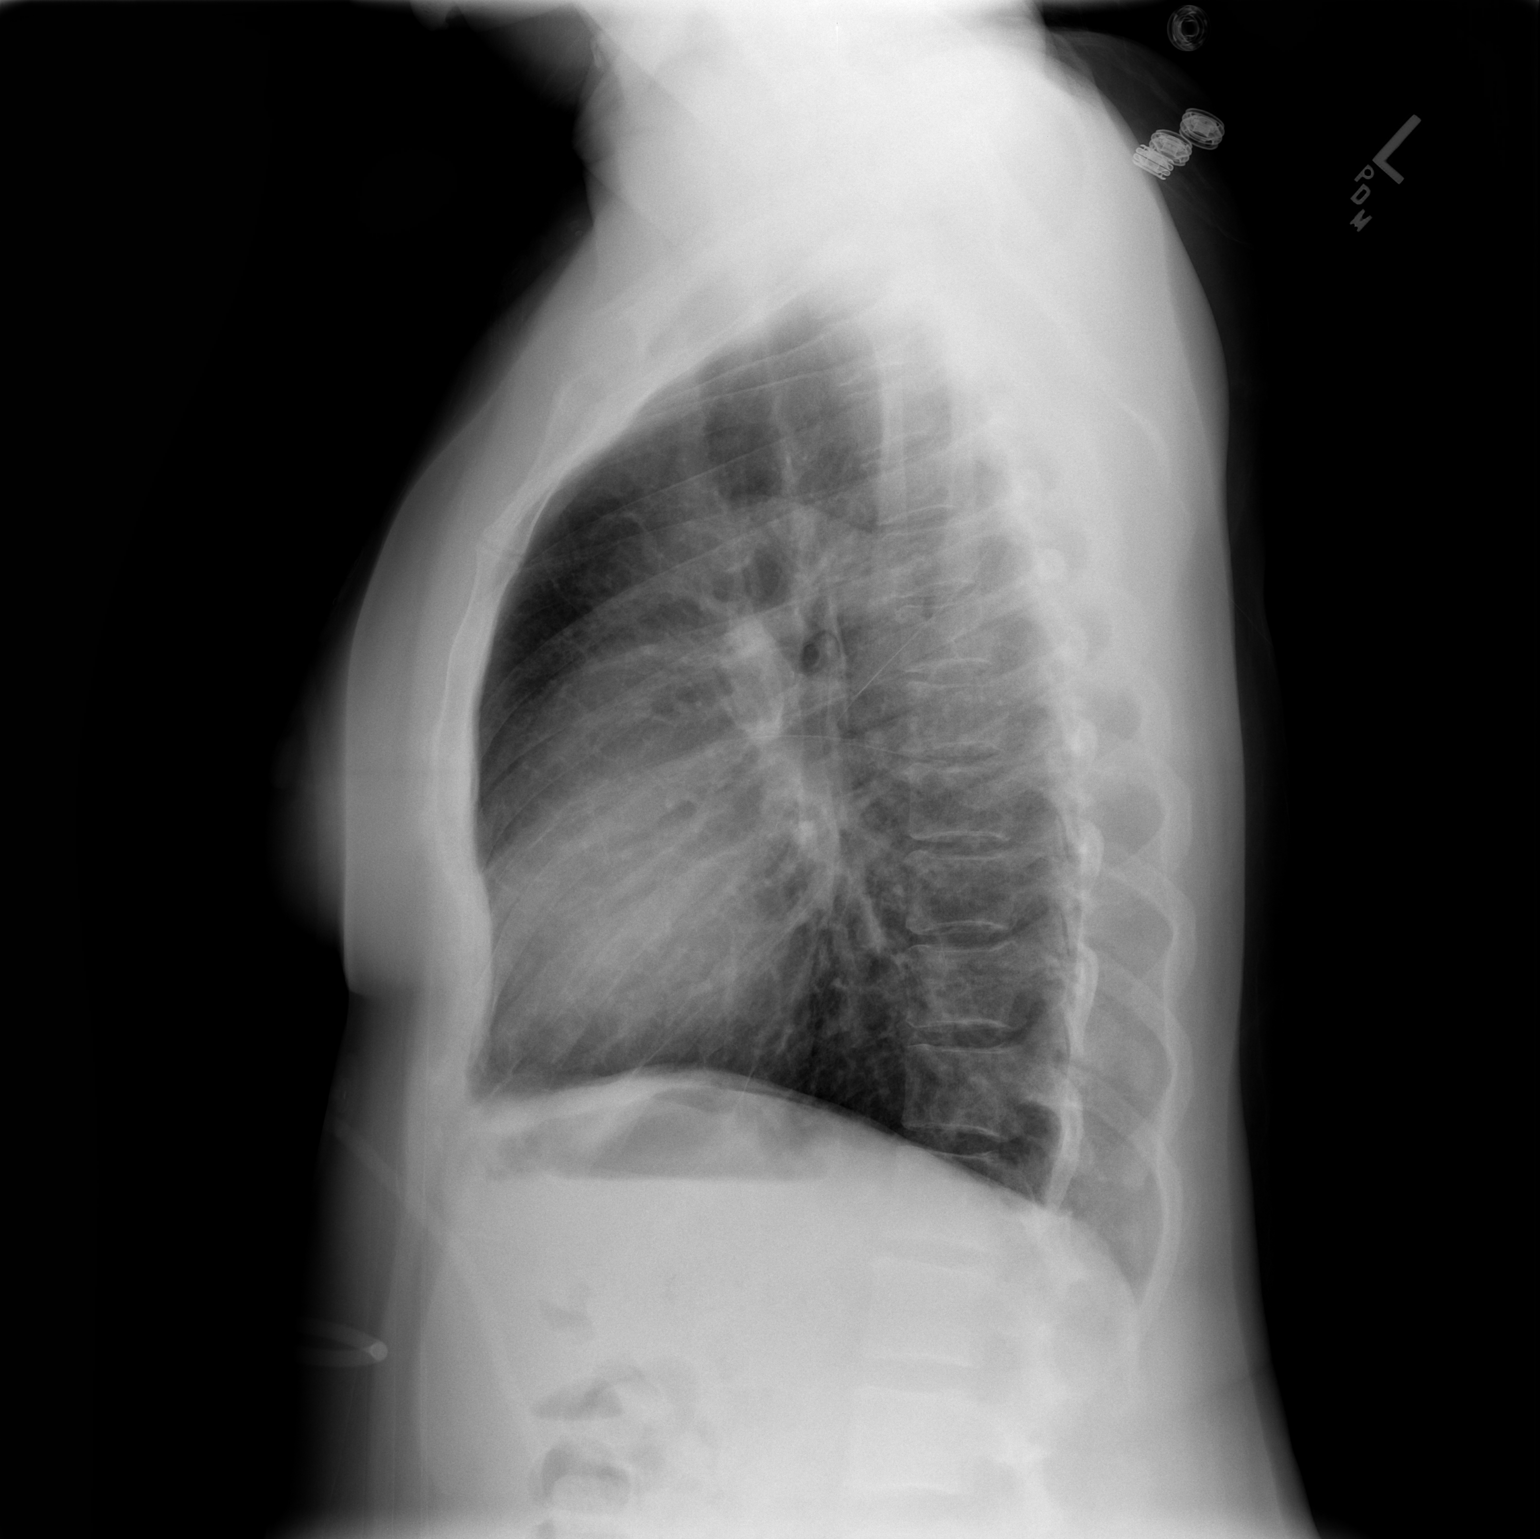

[2 of 2 positions shown; findings below may reference images not displayed]

FINDINGS: Upper normal heart size.
Normal mediastinal contours and pulmonary vascularity.
Lungs clear.
No pleural effusion or pneumothorax.
No acute bony findings.
IMPRESSION: No acute abnormalities.

## 2011-05-31 LAB — URINALYSIS, ROUTINE W REFLEX MICROSCOPIC
Specific Gravity, Urine: 1.023
Urobilinogen, UA: 0.2

## 2011-05-31 LAB — URINE MICROSCOPIC-ADD ON

## 2011-05-31 LAB — URINE CULTURE

## 2011-06-12 ENCOUNTER — Other Ambulatory Visit: Payer: Self-pay | Admitting: Internal Medicine

## 2011-09-24 ENCOUNTER — Other Ambulatory Visit: Payer: Self-pay | Admitting: Internal Medicine

## 2011-09-26 NOTE — Telephone Encounter (Signed)
Pt was called and asked to schedule an appointment.  Pt to scheduler to schedule an appointment.  Angelina Ok, RN 09/26/2011 2:34 PM.

## 2011-09-26 NOTE — Telephone Encounter (Signed)
Please call pt, she should come in for an appt, as was recommended during our last visit together.  Johnette Abraham, D.O.

## 2011-09-30 ENCOUNTER — Encounter: Payer: Self-pay | Admitting: Internal Medicine

## 2011-10-01 ENCOUNTER — Encounter: Payer: Self-pay | Admitting: Internal Medicine

## 2011-10-01 ENCOUNTER — Ambulatory Visit (INDEPENDENT_AMBULATORY_CARE_PROVIDER_SITE_OTHER): Payer: Self-pay | Admitting: Internal Medicine

## 2011-10-01 VITALS — BP 146/92 | HR 72 | Temp 98.9°F | Ht 62.0 in | Wt 174.8 lb

## 2011-10-01 DIAGNOSIS — I10 Essential (primary) hypertension: Secondary | ICD-10-CM

## 2011-10-01 DIAGNOSIS — Z0000 Encounter for general adult medical examination without abnormal findings: Secondary | ICD-10-CM

## 2011-10-01 DIAGNOSIS — E785 Hyperlipidemia, unspecified: Secondary | ICD-10-CM

## 2011-10-01 DIAGNOSIS — Z23 Encounter for immunization: Secondary | ICD-10-CM

## 2011-10-01 DIAGNOSIS — K648 Other hemorrhoids: Secondary | ICD-10-CM | POA: Insufficient documentation

## 2011-10-01 MED ORDER — PRAVASTATIN SODIUM 40 MG PO TABS: 40.0000 mg | ORAL_TABLET | Freq: Every day | ORAL | Status: DC

## 2011-10-01 MED ORDER — LISINOPRIL-HYDROCHLOROTHIAZIDE 20-25 MG PO TABS: 1.0000 | ORAL_TABLET | Freq: Every day | ORAL | Status: DC

## 2011-10-01 NOTE — Patient Instructions (Signed)
   Please follow-up at the clinic in March with me, at which time we will reevaluate your blood pressure, and you will need your yearly lab work at that time.  There have been changes in your medications  RESTART pravastatin  STOP hydrochlorothiazide  START lisinopril-hydrochlorothiazide  Please follow-up in the clinic sooner if needed.  If you have been started on new medication(s), and you develop symptoms concerning for allergic reaction, including, but not limited to, throat closing, tongue swelling, rash, please stop the medication immediately and call the clinic at [phone], and go to the ER.  If symptoms worsen, or new symptoms arise, please call the clinic or go to the ER.  Please bring all of your medications in a bag to your next visit.

## 2011-10-01 NOTE — Assessment & Plan Note (Signed)
Flu shot today 

## 2011-10-01 NOTE — Assessment & Plan Note (Signed)
Liver Function Tests:    Component Value Date/Time   AST 21 12/13/2010 1046   ALT 15 12/13/2010 1046   ALKPHOS 86 12/13/2010 1046   BILITOT 0.3 12/13/2010 1046   PROT 7.9 12/13/2010 1046   ALBUMIN 4.4 12/13/2010 1046    Lipid Panel:     Component Value Date/Time   CHOL 192 12/13/2010 1046   TRIG 48 12/13/2010 1046   HDL 59 12/13/2010 1046   CHOLHDL 3.3 12/13/2010 1046   VLDL 10 12/13/2010 1046   LDLCALC 123* 12/13/2010 1046     Assessment: Lipid Control: controlled  Progress toward goals: at goal  Barriers to meeting goals: nonadherence to medications - stopped taking her statin since October 2012, because she does not like to take so many medications. Never had side effects.    Plan:  Resume pravastatin.  Check fasting lipid panel and liver function tests next visit.  Recommended adherence to medications as prescribed.

## 2011-10-01 NOTE — Assessment & Plan Note (Signed)
BP Readings from Last 3 Encounters:  10/01/11 146/92  03/21/11 129/57  02/21/11 125/78    Basic Metabolic Panel:    Component Value Date/Time   NA 139 12/13/2010 1046   K 3.9 12/13/2010 1046   CL 103 12/13/2010 1046   CO2 27 12/13/2010 1046   BUN 13 12/13/2010 1046   CREATININE 1.01 12/13/2010 1046   CREATININE 0.88 01/25/2010 0950   GLUCOSE 102* 12/13/2010 1046   CALCIUM 10.1 12/13/2010 1046    Assessment: Hypertension Control: not controlled  Progress toward goals: deteriorated  Barriers to meeting goals: nonadherence to medications, completely stopped taking her lisinopril in October 2012 because she does not like taking so many pills.   Plan:  DC lisinopril and hctz  Start combination pill of Lisinopril-HCTZ 20-25 mg  Will recheck renal function and electrolytes at next office visit.  Encouraged to take medications as instructed.

## 2011-10-01 NOTE — Progress Notes (Signed)
Subjective:   Patient ID: Miranda Griffin female   DOB: [birth year] 56 y.o.   MRN: [id number]  HPI: [name] is a 56 y.o. with a PMHx of HTN, HLD who presented to clinic today for the following:  1) HTN -  Patient does not check blood pressure regularly at home. Currently taking HCTZ only - which she admits to occasionally also taking her hctz for headaches when she thinks they may be due to elevated blood pressure. States she stopped taking the lisinopril in 05/2011 because she could not afford it, and then never refilled it even after she had the money, because she doesn't like taking so many pills. denies headaches, dizziness, lightheadedness, chest pain, shortness of breath. does request refills today.   2) HLD - currently supposed to be taking Pravachol (pravastatin). However, she has also been out of this since October 2012. She never had symptoms with it, but just doesn't really like to take pills. Therefore, she stopped this medication, and was waiting to see if she might develop symptoms to give her reason to resume. Denies chest pain, difficulty breathing, palpitations, tachycardia, and muscle pains. does request refills today.   3) Preventative Health Care - flu shot due. Colonoscopy completed in 02/2011 - there was prior concern about her heme positive stools that wasn't worked up. Colonoscopy only showed internal hemorrhoids.    Review of Systems: Per HPI.  Current Outpatient Medications: Medication Sig  . hydrochlorothiazide (HYDRODIURIL) 25 MG tablet TAKE 1 TABLET BY MOUTH DAILY  . Methylsulfonylmethane ([employer] MSM) 1000 MG CAPS Take 1,000 mg by mouth daily.    . vitamin B-12 (CYANOCOBALAMIN) 100 MCG tablet Take 100 mcg by mouth daily.    Marland Kitchen lisinopril (PRINIVIL,ZESTRIL) 40 MG tablet Take 1 tablet (40 mg total) by mouth daily.  . pravastatin (PRAVACHOL) 40 MG tablet TAKE 1 TABLET BY MOUTH DAILY    Allergies: No Known Allergies   Past Medical History  Diagnosis Date    . Hypertension   . Hyperlipidemia   . Anemia 2008    Now resolved, baseline- 12.5  . Heme positive stool 5/10    Colonoscopy 02/2011 showing only internal hemorrhoids  . Internal hemorrhoids     per colonoscopy 02/2011    Past Surgical History  Procedure Date  . Knee surgery   . Cesarean section     x2, 1989, 1990     Objective:   Physical Exam: Filed Vitals:   10/01/11 1139  BP: 146/92  Pulse: 72  Temp: 98.9 F (37.2 C)     General: Vital signs reviewed and noted. Well-developed, well-nourished, in no acute distress; alert, appropriate and cooperative throughout examination.  Head: Normocephalic, atraumatic.  Eyes: conjunctivae/corneas clear. PERRL.  Ears: TM nonerythematous, not bulging, good light reflex bilaterally.  Nose: Mucous membranes moist, not inflammed, nonerythematous.  Throat: Oropharynx nonerythematous, no exudate appreciated.   Neck: No deformities, masses, or tenderness noted.  Lungs:  Normal respiratory effort. Clear to auscultation BL without crackles or wheezes.  Heart: RRR. S1 and S2 normal without gallop, rubs. (+) murmur.  Abdomen:  BS normoactive. Soft, Nondistended, non-tender.  No masses or organomegaly.  Extremities: No pretibial edema.     Assessment & Plan:  Case and plan of care discussed with Dr. Blanch Media.

## 2011-11-14 ENCOUNTER — Encounter: Payer: Self-pay | Admitting: Internal Medicine

## 2011-11-14 ENCOUNTER — Ambulatory Visit (INDEPENDENT_AMBULATORY_CARE_PROVIDER_SITE_OTHER): Payer: Self-pay | Admitting: Internal Medicine

## 2011-11-14 VITALS — BP 130/82 | HR 94 | Temp 97.9°F | Ht 62.0 in | Wt 174.5 lb

## 2011-11-14 DIAGNOSIS — R51 Headache: Secondary | ICD-10-CM

## 2011-11-14 DIAGNOSIS — I10 Essential (primary) hypertension: Secondary | ICD-10-CM

## 2011-11-14 DIAGNOSIS — E66811 Obesity, class 1: Secondary | ICD-10-CM

## 2011-11-14 DIAGNOSIS — R5383 Other fatigue: Secondary | ICD-10-CM | POA: Insufficient documentation

## 2011-11-14 DIAGNOSIS — E785 Hyperlipidemia, unspecified: Secondary | ICD-10-CM

## 2011-11-14 DIAGNOSIS — R519 Headache, unspecified: Secondary | ICD-10-CM

## 2011-11-14 DIAGNOSIS — E669 Obesity, unspecified: Secondary | ICD-10-CM | POA: Insufficient documentation

## 2011-11-14 NOTE — Progress Notes (Signed)
  Subjective:    Patient ID: Miranda Griffin female   DOB: 17-Jul-1956 56 y.o.   MRN: [id number]  HPI: [name] is a 56 y.o. with a PMHx of HTN, HLD who presented to clinic today for the following:  1) HTN -  Patient does not check blood pressure regularly at home. Currently taking Lisinopril-HCTZ 20-25mg  daily without missing doses, she was changed to this medication during last visit in 09/2011 because HCTZ alone was not adequately controlled. Denies dizziness, lightheadedness, chest pain, shortness of breath. does not request refills today.   2) HLD - currently taking Pravachol (pravastatin) - not missing doses since last visit. Was previously noncompliant from Oct 2012 to February 2013). Denies chest pain, difficulty breathing, palpitations, tachycardia, and muscle pains. does request refills today.   3) Preventative Health Care - uptodate.  4) Headaches - patient having occasional headaches behind eyes for several (6-7) years, sometimes can occur a few times a week. No current headache. Aggravating factors: nothing that she knows of, alleviating factors: sometimes just goes away or drinking coffee helps. denies recent trauma, MVA, falls. Family history: no known family members with significant headaches. Denies associated none, nausea, photophobia, phonophobia, weakness, and vomiting. No fevers, chills. States she has recently started drinking 2 cups coffee daily.   Review of Systems: Per HPI.  Current Outpatient Medications: Medication Sig  . lisinopril-hydrochlorothiazide (PRINZIDE,ZESTORETIC) 20-25 MG per tablet Take 1 tablet by mouth daily.  . pravastatin (PRAVACHOL) 40 MG tablet Take 1 tablet (40 mg total) by mouth daily.  . vitamin B-12 (CYANOCOBALAMIN) 100 MCG tablet Take 100 mcg by mouth daily.      Allergies: No Known Allergies  Past Medical History  Diagnosis Date  . Hypertension   . Hyperlipidemia   . Anemia 2008    Now resolved, baseline- 12.5  . Heme  positive stool 5/10    Colonoscopy 02/2011 showing only internal hemorrhoids  . Internal hemorrhoids     per colonoscopy 02/2011    Past Surgical History  Procedure Date  . Knee surgery   . Cesarean section     x2, 1989, 1990     Objective:    Physical Exam: Filed Vitals:   11/14/11 1544  BP: 130/82  Pulse:   Temp:      General: Vital signs reviewed and noted. Well-developed, well-nourished, in no acute distress; alert, appropriate and cooperative throughout examination.  Head: Normocephalic, atraumatic.  Eyes: conjunctivae/corneas clear. PERRL.  Nose: Mucous membranes moist, not inflammed, nonerythematous.  Throat: Oropharynx nonerythematous, no exudate appreciated.   Neck: No deformities, masses, or tenderness noted.  Lungs:  Normal respiratory effort. Clear to auscultation BL without crackles or wheezes.  Heart: RRR. S1 and S2 normal without gallop, rubs. (+) murmur.  Abdomen:  BS normoactive. Soft, Nondistended, non-tender.  No masses or organomegaly.  Extremities: No pretibial edema.     Assessment/ Plan:   Case and plan of care discussed with Dr. Lina Sayre.

## 2011-11-14 NOTE — Patient Instructions (Signed)
   Please follow-up at the clinic in 3 months, at which time we will reevaluate your blood pressure - OR, please follow-up in the clinic sooner if needed.  Please see Norm Parcel for nutrition counseling.  There have not been changes in your medications.    I am checking labs today. If they are abnormal, I will call you and let you know.  Please start keeping a headache diary - keep track of what causes, circumstances are around your headaches.  If you have been started on new medication(s), and you develop symptoms concerning for allergic reaction, including, but not limited to, throat closing, tongue swelling, rash, please stop the medication immediately and call the clinic at [phone], and go to the ER.  If symptoms worsen, or new symptoms arise, please call the clinic or go to the ER.  Please bring all of your medications in a bag to your next visit.

## 2011-11-15 LAB — LIPID PANEL
Cholesterol: 203 mg/dL — ABNORMAL HIGH (ref 0–200)
Triglycerides: 88 mg/dL (ref <150)
VLDL: 18 mg/dL (ref 0–40)

## 2011-11-15 LAB — T4, FREE: Free T4: 1.09 ng/dL (ref 0.80–1.80)

## 2011-11-15 LAB — TSH: TSH: 1.312 u[IU]/mL (ref 0.350–4.500)

## 2011-11-17 NOTE — Assessment & Plan Note (Signed)
Assessment: Patient states that despite exercising regularly, she is not losing weight.   Plan:      Nutrition referral for dietary counseling and weight loss.  Check TFTs

## 2011-11-17 NOTE — Assessment & Plan Note (Signed)
Pertinent Labs: Liver Function Tests:    Component Value Date/Time   AST 21 12/13/2010 1046   ALT 15 12/13/2010 1046   ALKPHOS 86 12/13/2010 1046   BILITOT 0.3 12/13/2010 1046   PROT 7.9 12/13/2010 1046   ALBUMIN 4.4 12/13/2010 1046    Lipid Panel:     Component Value Date/Time   CHOL 203* 11/14/2011 1541   TRIG 88 11/14/2011 1541   HDL 62 11/14/2011 1541   CHOLHDL 3.3 11/14/2011 1541   VLDL 18 11/14/2011 1541   LDLCALC 123* 11/14/2011 1541     Assessment: Status: - Patient previously noncompliant with her medication from October 2012 - February 2013.  - Patient is not fasting today.  - Patient is compliant all of the time with prescribed medications since last visti 09/2011.   Disease Control: controlled  Progress toward goals: at goal  Barriers to meeting goals: no barriers identified, previously was noncompliant   Plan:  continue current medications  Check lipid panel.

## 2011-11-17 NOTE — Assessment & Plan Note (Signed)
Pertinent Data: BP Readings from Last 3 Encounters:  11/14/11 130/82  10/01/11 146/92  03/21/11 129/57    Basic Metabolic Panel:    Component Value Date/Time   NA 139 12/13/2010 1046   K 3.9 12/13/2010 1046   CL 103 12/13/2010 1046   CO2 27 12/13/2010 1046   BUN 13 12/13/2010 1046   CREATININE 1.01 12/13/2010 1046   CREATININE 0.88 01/25/2010 0950   GLUCOSE 102* 12/13/2010 1046   CALCIUM 10.1 12/13/2010 1046    Assessment: Status: - Changed to Lisinopril-HCTZ (from HCTZ in 09/2011).   - Patient is compliant all of the time with prescribed medications.   Disease Control: controlled  Progress toward goals: at goal  Barriers to meeting goals: no barriers identified   Plan:  continue current medications  BMET today to assess renal function/ electrolytes after addition of ACE-I.

## 2011-11-17 NOTE — Assessment & Plan Note (Signed)
Assessment: Unclear cause, not really consistent with any specific headache syndrome. Possible tension headache, versus associated with caffeine usage.  Plan:      Asked to make a headache diary for [place] to review next visit.

## 2011-11-19 NOTE — Progress Notes (Signed)
Addended by: Priscella Mann on: 11/19/2011 12:29 PM   Modules accepted: Orders

## 2011-12-03 ENCOUNTER — Encounter: Payer: Self-pay | Admitting: Dietician

## 2011-12-03 ENCOUNTER — Ambulatory Visit (INDEPENDENT_AMBULATORY_CARE_PROVIDER_SITE_OTHER): Payer: Self-pay | Admitting: Dietician

## 2011-12-03 ENCOUNTER — Other Ambulatory Visit: Payer: Self-pay

## 2011-12-03 VITALS — Ht 61.75 in | Wt 175.2 lb

## 2011-12-03 DIAGNOSIS — I10 Essential (primary) hypertension: Secondary | ICD-10-CM

## 2011-12-03 DIAGNOSIS — E669 Obesity, unspecified: Secondary | ICD-10-CM

## 2011-12-03 LAB — BASIC METABOLIC PANEL
BUN: 16 mg/dL (ref 6–23)
CO2: 29 mEq/L (ref 19–32)
Chloride: 104 mEq/L (ref 96–112)
Creat: 0.95 mg/dL (ref 0.50–1.10)
Glucose, Bld: 89 mg/dL (ref 70–99)
Potassium: 4.1 mEq/L (ref 3.5–5.3)

## 2011-12-03 NOTE — Progress Notes (Signed)
Medical Nutrition Therapy:  Appt start time: 0930 end time:  1030.  Assessment:  Primary concerns today: Weight management.  Usual eating pattern includes 3 meals and 3 snacks per day. No difficulty sleeping. Not aware that she may have anemia. Cooks most foods at home. Estimated needs 1800-2000 calories/day for maintenance of current weight. Reasonable weight 130-150#. BMI 32.32 consistent with obesity and patient is aware.Everyday foods include coffee, fruit,vegetables.  Avoided foods include soda.   24-hr recall: B (8-9 AM)-  Egg, toast,coffe with 1-2 tsp sugar and whole milk, sometimes muffin or fruit pie Snk ( 11 AM)-  Fruit. Homemade biscuit L ( 11 AM- 1PM)-Fried rice with meat and vegetables, 1/2 cup yam flour Snk ( PM)- fruit, biscuit D ( PM)- beans, rice, meat, potato soup, vegetables Snk ( PM)- fruit,candy,cookies  Usual physical activity includes walks often due to no car- used to have 90 minute/day activity program.  Progress Towards Goal(s):  In progress.   Nutritional Diagnosis:  [place]-3.3 Overweight/obesity As related to lack of prior exposure to nutrtional information.  As evidenced by her questions and report.    Intervention:  1.  Nutrition education on weight loss 2. Meal plan calculated/provided for 1450-1500 calories & ~45- 50 grams fat/day 3- educated on how to read labels. Calculate calories in recipes  Monitoring/Evaluation:  Dietary intake, exercise, and body weight in 3 week(s).

## 2011-12-03 NOTE — Patient Instructions (Signed)
Miranda Griffin plan for weight loss 12/03/11 60-90 minutes of physical activity each day    Estimated calorie needs each day 1400-1500 calories Estimated fat grams 45- 50 grams each day  Breakfast- within 1 hour of waking, should contain some carb and protein (300-400 calories & 5-15 grams fatExample breakfasts: 1 egg or 1 Tbsp peanutbutter or  cup cottage cheese (protein and fat), toast and fruit (carbohydrate)            Yogurt (protein and carbohydrate) and fruit (carbohydrate) Lunch- 4-6 hours after breakfast (400-600 calories & 10-15 grams fat)    Example lunches: sandwich on whole grain bread and fruit, water    Vegetable plate with beans, non-starchy veggies, olive oil, fruit and water Dinner- 4-6 hours after lunch (400-600 calories & 10-15 grams fat) Example dinners: starchy food, protein (baked, boiled, grilled meat, tofu, beans or other protein), vegetables, fruit and water or skim milk or yogurt for dessert  Make an appointment to follow up in 3 weeks. Over the next 3 weeks please write down everything you eat and drink in as much detail as possible for 3 days and bring this with you.

## 2012-08-12 ENCOUNTER — Ambulatory Visit: Payer: Self-pay

## 2012-08-12 ENCOUNTER — Encounter: Payer: Self-pay | Admitting: Internal Medicine

## 2012-08-12 ENCOUNTER — Ambulatory Visit (INDEPENDENT_AMBULATORY_CARE_PROVIDER_SITE_OTHER): Payer: Self-pay | Admitting: Internal Medicine

## 2012-08-12 VITALS — BP 134/82 | HR 76 | Temp 97.4°F | Ht 62.0 in | Wt 169.5 lb

## 2012-08-12 DIAGNOSIS — Z23 Encounter for immunization: Secondary | ICD-10-CM

## 2012-08-12 DIAGNOSIS — E66811 Obesity, class 1: Secondary | ICD-10-CM

## 2012-08-12 DIAGNOSIS — Z0000 Encounter for general adult medical examination without abnormal findings: Secondary | ICD-10-CM

## 2012-08-12 DIAGNOSIS — E669 Obesity, unspecified: Secondary | ICD-10-CM

## 2012-08-12 DIAGNOSIS — E785 Hyperlipidemia, unspecified: Secondary | ICD-10-CM

## 2012-08-12 DIAGNOSIS — I10 Essential (primary) hypertension: Secondary | ICD-10-CM

## 2012-08-12 NOTE — Progress Notes (Signed)
   Patient: Miranda Griffin   MRN: [id number]  DOB: 09-10-1955  PCP: Priscella Mann, DO   Subjective:    HPI: Ms. Miranda Griffin is a 56 y.o. female with a PMHx of HTN, HLD, and obesity (BMI 32) who presented to clinic today for routine health maintenance visit to address the following:  1) HTN -  Patient does not check blood pressure regularly at home. Currently taking Lisinopril-HCTZ 20-25mg  daily without missing doses, she was changed to this medication in 09/2011 because HCTZ alone was not adequately controlled. Denies dizziness, lightheadedness, chest pain, shortness of breath. does not request refills today.   2) HLD - currently taking Pravachol (pravastatin) - not missing doses since last visit. Was previously noncompliant from Oct 2012 to February 2013). Denies chest pain, difficulty breathing, palpitations, tachycardia, and muscle pains. does request refills today.   3) Preventative Health Care - due for flu shot.  4) Headaches - during our last visit together in March 2013, the patient complained about occasional headaches behind eyes for several (6-7) years, sometimes can occur a few times a week. The headaches were of unclear etiology as they did not clearly fit with any specific headache syndrome. It was considered that these may represent possible tension headache versus associated with caffeine usage as she was drinking 2 cups of coffee daily. I have asked her to start a headache diary for [place] to review during our visit together today. However, she remarkably has not had recurrence of her symptoms.   Review of Systems: Per HPI.   Current Outpatient Medications: Medication Sig  . lisinopril-hydrochlorothiazide (PRINZIDE,ZESTORETIC) 20-25 MG per tablet Take 1 tablet by mouth daily.  . pravastatin (PRAVACHOL) 40 MG tablet Take 1 tablet (40 mg total) by mouth daily.  . vitamin B-12 (CYANOCOBALAMIN) 100 MCG tablet Take 100 mcg by mouth daily.      Allergies No Known  Allergies   Past Medical History  Diagnosis Date  . Hypertension   . Hyperlipidemia   . Anemia 2008    Now resolved, baseline- 12.5  . Heme positive stool 5/10    Colonoscopy 02/2011 showing only internal hemorrhoids  . Internal hemorrhoids     per colonoscopy 02/2011    Past Surgical History  Procedure Date  . Knee surgery   . Cesarean section     x2, 1989, 1990     Objective:    Physical Exam: Filed Vitals:   08/12/12 1350  BP: 134/82  Pulse: 76  Temp:       General: Vital signs reviewed and noted. Well-developed, well-nourished, in no acute distress; alert, appropriate and cooperative throughout examination.  Head: Normocephalic, atraumatic.  Eyes: conjunctivae/corneas clear. PERRL.  Nose: Mucous membranes moist, not inflammed, nonerythematous.  Throat: Oropharynx nonerythematous, no exudate appreciated.   Neck: No deformities, masses, or tenderness noted.  Lungs:  Normal respiratory effort. Clear to auscultation BL without crackles or wheezes.  Heart: RRR. S1 and S2 normal without gallop, rubs. (+) murmur.  Abdomen:  BS normoactive. Soft, Nondistended, non-tender.  No masses or organomegaly.  Extremities: No pretibial edema.     Assessment/ Plan:   Case and plan of care discussed with attending physician, Dr. Jonah Blue.

## 2012-08-12 NOTE — Assessment & Plan Note (Signed)
Pertinent Data: BP Readings from Last 3 Encounters:  08/12/12 134/82  11/14/11 130/82  10/01/11 146/92    Basic Metabolic Panel:    Component Value Date/Time   NA 141 12/03/2011 1101   K 4.1 12/03/2011 1101   CL 104 12/03/2011 1101   CO2 29 12/03/2011 1101   BUN 16 12/03/2011 1101   CREATININE 0.95 12/03/2011 1101   CREATININE 0.88 01/25/2010 0950   GLUCOSE 89 12/03/2011 1101   CALCIUM 9.7 12/03/2011 1101    Assessment: Disease Control:   at goal   Progress toward goals:   at goal   Barriers to meeting goals: no barriers identified    She is doing fantastic job towards losing weight. Has lost 6 pounds since the last visit with me in March 2013.    Patient is compliant all of the time with prescribed medications.   Plan:  continue current medications  Educational resources provided:    Self management tools provided:

## 2012-08-12 NOTE — Assessment & Plan Note (Signed)
Pertinent Labs: Liver Function Tests:    Component Value Date/Time   AST 21 12/13/2010 1046   ALT 15 12/13/2010 1046   ALKPHOS 86 12/13/2010 1046   BILITOT 0.3 12/13/2010 1046   PROT 7.9 12/13/2010 1046   ALBUMIN 4.4 12/13/2010 1046    Lipid Panel:     Component Value Date/Time   CHOL 203* 11/14/2011 1541   TRIG 88 11/14/2011 1541   HDL 62 11/14/2011 1541   CHOLHDL 3.3 11/14/2011 1541   VLDL 18 11/14/2011 1541   LDLCALC 123* 11/14/2011 1541     Assessment: Goal LDL (per ATP guidelines): < 130 mg/dL  Disease Control: controlled  Progress toward goals: at goal  Barriers to meeting goals: no barriers identified    Patient is compliant all of the time with prescribed medications.    Plan:  continue current medications

## 2012-08-12 NOTE — Patient Instructions (Signed)
General Instructions:  Please follow-up at the clinic in March 2014 with me, at which time we will reevaluate your blood pressure, cholesterol - OR, please follow-up in the clinic sooner if needed.  There have not been changes in your medications.    If you have been started on new medication(s), and you develop symptoms concerning for allergic reaction, including, but not limited to, throat closing, tongue swelling, rash, please stop the medication immediately and call the clinic at [phone], and go to the ER.  If you are diabetic, please bring your meter to your next visit.  If symptoms worsen, or new symptoms arise, please call the clinic or go to the ER.  PLEASE BRING ALL OF YOUR MEDICATIONS  IN A BAG TO YOUR NEXT APPOINTMENT   Treatment Goals:  Goals (1 Years of Data) as of 08/12/2012          As of Today 12/03/11 11/14/11 10/01/11 03/21/11     Weight    . Weight < 165 lb (74.844 kg)  169 lb 8 oz (76.885 kg) 175 lb 3.2 oz (79.47 kg) 174 lb 8 oz (79.153 kg) 174 lb 12.8 oz (79.289 kg) 170 lb (77.111 kg)      Progress Toward Treatment Goals:    Self Care Goals & Plans:  Self Care Goal 08/12/2012  Manage my medications take my medicines as prescribed; bring my medications to every visit; refill my medications on time  Monitor my health keep track of my weight; keep track of my blood pressure  Eat healthy foods eat more vegetables; eat foods that are low in salt  Be physically active take a walk every day       Care Management & Community Referrals:

## 2012-08-12 NOTE — Assessment & Plan Note (Signed)
Pertinent Data: Vitals - 1 value per visit 08/12/2012 12/03/2011 11/14/2011 10/01/2011  Weight (lb) 169.5 175.2 174.5 174.8  HEIGHT 5\' 2"  5' 1.75" 5\' 2"  5\' 2"   BMI 30.99 32.32 31.91 31.96   Assessment: The patient met with Norm Parcel in March 2013 regarding dietary counseling to help with weight loss. She states that she is incorporated several of the recommendations that Lupita Leash made. She is making tremendous strides towards weight loss and healthy living.  Plan:      Graduated on her efforts.   Commended continued efforts towards healthy living and exercise.

## 2012-08-12 NOTE — Assessment & Plan Note (Signed)
Health Maintenance  Topic Date Due  . Influenza Vaccine  04/30/2012  . Mammogram  02/21/2013  . Pap Smear  12/05/2013  . Tetanus/tdap  12/12/2020  . Colonoscopy  03/20/2021    Assessment:  Procedures due: None  Labs due: None  Immunizations due: Flu shot  Plan:  Flu shot today.

## 2012-10-06 ENCOUNTER — Other Ambulatory Visit: Payer: Self-pay | Admitting: Internal Medicine

## 2013-06-30 ENCOUNTER — Encounter: Payer: Self-pay | Admitting: Internal Medicine

## 2014-06-27 ENCOUNTER — Inpatient Hospital Stay (HOSPITAL_COMMUNITY)
Admission: AD | Admit: 2014-06-27 | Discharge: 2014-07-07 | DRG: 885 | Disposition: A | Discharge status: Home / Self Care | Payer: Federal, State, Local not specified - Other | Source: Intra-hospital | Attending: Psychiatry | Admitting: Psychiatry

## 2014-06-27 ENCOUNTER — Encounter (HOSPITAL_COMMUNITY): Payer: Self-pay

## 2014-06-27 ENCOUNTER — Emergency Department (HOSPITAL_COMMUNITY): Payer: Self-pay

## 2014-06-27 ENCOUNTER — Emergency Department (HOSPITAL_COMMUNITY)
Admission: EM | Admit: 2014-06-27 | Discharge: 2014-06-27 | Disposition: A | Discharge status: Other Acute Inpatient Hospital | Payer: Self-pay | Attending: Emergency Medicine | Admitting: Emergency Medicine

## 2014-06-27 ENCOUNTER — Encounter (HOSPITAL_COMMUNITY): Payer: Self-pay | Admitting: *Deleted

## 2014-06-27 DIAGNOSIS — I10 Essential (primary) hypertension: Secondary | ICD-10-CM | POA: Diagnosis present

## 2014-06-27 DIAGNOSIS — Z79899 Other long term (current) drug therapy: Secondary | ICD-10-CM | POA: Diagnosis not present

## 2014-06-27 DIAGNOSIS — F29 Unspecified psychosis not due to a substance or known physiological condition: Secondary | ICD-10-CM | POA: Diagnosis present

## 2014-06-27 DIAGNOSIS — E785 Hyperlipidemia, unspecified: Secondary | ICD-10-CM | POA: Diagnosis present

## 2014-06-27 DIAGNOSIS — Z862 Personal history of diseases of the blood and blood-forming organs and certain disorders involving the immune mechanism: Secondary | ICD-10-CM | POA: Insufficient documentation

## 2014-06-27 DIAGNOSIS — Z8719 Personal history of other diseases of the digestive system: Secondary | ICD-10-CM | POA: Insufficient documentation

## 2014-06-27 DIAGNOSIS — R443 Hallucinations, unspecified: Secondary | ICD-10-CM | POA: Insufficient documentation

## 2014-06-27 DIAGNOSIS — F200 Paranoid schizophrenia: Secondary | ICD-10-CM | POA: Diagnosis present

## 2014-06-27 DIAGNOSIS — R4182 Altered mental status, unspecified: Secondary | ICD-10-CM | POA: Insufficient documentation

## 2014-06-27 LAB — CBC WITH DIFFERENTIAL/PLATELET
BASOS ABS: 0 10*3/uL (ref 0.0–0.1)
Basophils Relative: 1 % (ref 0–1)
Eosinophils Absolute: 0 10*3/uL (ref 0.0–0.7)
Eosinophils Relative: 0 % (ref 0–5)
HEMATOCRIT: 41.4 % (ref 36.0–46.0)
HEMOGLOBIN: 14 g/dL (ref 12.0–15.0)
LYMPHS ABS: 1.4 10*3/uL (ref 0.7–4.0)
LYMPHS PCT: 32 % (ref 12–46)
MCH: 29 pg (ref 26.0–34.0)
MCHC: 33.8 g/dL (ref 30.0–36.0)
MCV: 85.7 fL (ref 78.0–100.0)
MONO ABS: 0.4 10*3/uL (ref 0.1–1.0)
MONOS PCT: 9 % (ref 3–12)
NEUTROS ABS: 2.5 10*3/uL (ref 1.7–7.7)
Neutrophils Relative %: 58 % (ref 43–77)
Platelets: 194 10*3/uL (ref 150–400)
RBC: 4.83 MIL/uL (ref 3.87–5.11)
RDW: 13.4 % (ref 11.5–15.5)
WBC: 4.3 10*3/uL (ref 4.0–10.5)

## 2014-06-27 LAB — COMPREHENSIVE METABOLIC PANEL
ALT: 12 U/L (ref 0–35)
AST: 16 U/L (ref 0–37)
Albumin: 4.1 g/dL (ref 3.5–5.2)
Alkaline Phosphatase: 111 U/L (ref 39–117)
Anion gap: 15 (ref 5–15)
BILIRUBIN TOTAL: 0.3 mg/dL (ref 0.3–1.2)
BUN: 14 mg/dL (ref 6–23)
CHLORIDE: 103 meq/L (ref 96–112)
CO2: 24 meq/L (ref 19–32)
CREATININE: 0.91 mg/dL (ref 0.50–1.10)
Calcium: 9.8 mg/dL (ref 8.4–10.5)
GFR calc Af Amer: 80 mL/min — ABNORMAL LOW (ref >90)
GFR, EST NON AFRICAN AMERICAN: 69 mL/min — AB (ref >90)
GLUCOSE: 92 mg/dL (ref 70–99)
Potassium: 3.5 mEq/L — ABNORMAL LOW (ref 3.7–5.3)
Sodium: 142 mEq/L (ref 137–147)
Total Protein: 8.5 g/dL — ABNORMAL HIGH (ref 6.0–8.3)

## 2014-06-27 LAB — URINALYSIS, ROUTINE W REFLEX MICROSCOPIC
Bilirubin Urine: NEGATIVE
GLUCOSE, UA: NEGATIVE mg/dL
Hgb urine dipstick: NEGATIVE
Ketones, ur: 15 mg/dL — AB
LEUKOCYTES UA: NEGATIVE
Nitrite: NEGATIVE
PROTEIN: NEGATIVE mg/dL
Specific Gravity, Urine: 1.01 (ref 1.005–1.030)
UROBILINOGEN UA: 0.2 mg/dL (ref 0.0–1.0)
pH: 6.5 (ref 5.0–8.0)

## 2014-06-27 LAB — RAPID URINE DRUG SCREEN, HOSP PERFORMED
Amphetamines: NOT DETECTED
BARBITURATES: NOT DETECTED
Benzodiazepines: NOT DETECTED
Cocaine: NOT DETECTED
OPIATES: NOT DETECTED
TETRAHYDROCANNABINOL: NOT DETECTED

## 2014-06-27 LAB — ETHANOL: Alcohol, Ethyl (B): <11 mg/dL (ref 0–11)

## 2014-06-27 MED ORDER — HYDRALAZINE HCL 10 MG PO TABS
10.0000 mg | ORAL_TABLET | Freq: Three times a day (TID) | ORAL | Status: DC
  Administered 2014-06-27 – 2014-07-07 (×27): 10 mg via ORAL
  Filled 2014-06-27 (×11): qty 1
  Filled 2014-06-27 (×2): qty 42
  Filled 2014-06-27 (×23): qty 1
  Filled 2014-06-27: qty 42
  Filled 2014-06-27 (×2): qty 1

## 2014-06-27 MED ORDER — HYDROXYZINE HCL 25 MG PO TABS
25.0000 mg | ORAL_TABLET | Freq: Four times a day (QID) | ORAL | Status: DC | PRN
  Administered 2014-06-27: 25 mg via ORAL
  Filled 2014-06-27: qty 56
  Filled 2014-06-27 (×2): qty 1

## 2014-06-27 MED ORDER — LISINOPRIL 20 MG PO TABS
20.0000 mg | ORAL_TABLET | Freq: Every day | ORAL | Status: DC
  Administered 2014-06-28 – 2014-07-07 (×10): 20 mg via ORAL
  Filled 2014-06-27 (×12): qty 1
  Filled 2014-06-27: qty 14

## 2014-06-27 MED ORDER — ALUM & MAG HYDROXIDE-SIMETH 200-200-20 MG/5ML PO SUSP: 30.0000 mL | ORAL | Status: DC | PRN

## 2014-06-27 MED ORDER — ACETAMINOPHEN 325 MG PO TABS
650.0000 mg | ORAL_TABLET | Freq: Four times a day (QID) | ORAL | Status: DC | PRN
  Administered 2014-06-28: 650 mg via ORAL
  Filled 2014-06-27: qty 2

## 2014-06-27 MED ORDER — GI COCKTAIL ~~LOC~~
30.0000 mL | Freq: Once | ORAL | Status: AC
  Administered 2014-06-27: 30 mL via ORAL
  Filled 2014-06-27: qty 30

## 2014-06-27 MED ORDER — PANTOPRAZOLE SODIUM 40 MG PO TBEC
40.0000 mg | DELAYED_RELEASE_TABLET | ORAL | Status: AC
  Administered 2014-06-27: 40 mg via ORAL
  Filled 2014-06-27: qty 1

## 2014-06-27 MED ORDER — PRAVASTATIN SODIUM 40 MG PO TABS
40.0000 mg | ORAL_TABLET | Freq: Every day | ORAL | Status: DC
  Administered 2014-06-28 – 2014-07-06 (×9): 40 mg via ORAL
  Filled 2014-06-27 (×5): qty 1
  Filled 2014-06-27: qty 14
  Filled 2014-06-27 (×5): qty 1

## 2014-06-27 MED ORDER — IBUPROFEN 600 MG PO TABS
600.0000 mg | ORAL_TABLET | Freq: Four times a day (QID) | ORAL | Status: DC | PRN
  Administered 2014-06-29 – 2014-06-30 (×2): 600 mg via ORAL
  Filled 2014-06-27: qty 42
  Filled 2014-06-27 (×3): qty 1

## 2014-06-27 MED ORDER — LISINOPRIL-HYDROCHLOROTHIAZIDE 20-25 MG PO TABS: 1.0000 | ORAL_TABLET | Freq: Every day | ORAL | Status: DC

## 2014-06-27 MED ORDER — TRAZODONE HCL 50 MG PO TABS
50.0000 mg | ORAL_TABLET | Freq: Every evening | ORAL | Status: DC | PRN
  Administered 2014-06-27 – 2014-07-02 (×5): 50 mg via ORAL
  Filled 2014-06-27 (×17): qty 1

## 2014-06-27 MED ORDER — MAGNESIUM HYDROXIDE 400 MG/5ML PO SUSP: 30.0000 mL | Freq: Every day | ORAL | Status: DC | PRN

## 2014-06-27 MED ORDER — HYDROCHLOROTHIAZIDE 25 MG PO TABS
25.0000 mg | ORAL_TABLET | Freq: Every day | ORAL | Status: DC
  Administered 2014-06-28 – 2014-07-07 (×10): 25 mg via ORAL
  Filled 2014-06-27 (×12): qty 1

## 2014-06-27 MED ORDER — ADULT MULTIVITAMIN W/MINERALS CH
1.0000 | ORAL_TABLET | Freq: Every day | ORAL | Status: DC
  Administered 2014-06-28 – 2014-07-07 (×10): 1 via ORAL
  Filled 2014-06-27 (×4): qty 1
  Filled 2014-06-27: qty 14
  Filled 2014-06-27 (×9): qty 1

## 2014-06-27 MED ORDER — INFLUENZA VAC SPLIT QUAD 0.5 ML IM SUSY
0.5000 mL | PREFILLED_SYRINGE | INTRAMUSCULAR | Status: AC
  Administered 2014-06-29: 0.5 mL via INTRAMUSCULAR
  Filled 2014-06-27: qty 0.5

## 2014-06-27 MED ORDER — HYDROCHLOROTHIAZIDE 25 MG PO TABS
25.0000 mg | ORAL_TABLET | Freq: Every day | ORAL | Status: DC
  Administered 2014-06-27: 25 mg via ORAL
  Filled 2014-06-27 (×2): qty 1

## 2014-06-27 NOTE — BH Assessment (Signed)
Tele Assessment Note   Alter is a 58 year old [place] female that reports hearing voices of people cursing at her.  Patient reports that the voices are curing her family.  Patient reports that the voices are not telling her to harm herself or others.  Patient reports that she has been hearing these voices for the past month.  Patient reports that she hears the voices every day and states they sometimes call her name. Patient reports that they are not voices of people she knows and states they intermittently wake her up at night.  Documentation in epic reports that that the patient stated that the voices tell her they are going to murder someone.     Patient reports that she has experiences stress related to substandard living conditions at her apartment.  Patient reports that she has to move out last Wednesday to the [employer].  Patient denies a prior history of psychiatric hospitalization.  Patient denies prior mental health treatment.  Patient denies substance abuse.  Patient BAL is <11 and there is not a UDS completed on the patient.  Patient denies SI and HI.     Patient brought herself to the ED initially complaining of a sensation of something stuck in her throat. Patient reports that she has had this feeling intermittently since 02/24/14.  Patient reports that she has difficulty speaking because it feels like something is blocking her throat..  Axis I: Mood Disorder NOS Axis II: Deferred Axis III:  Past Medical History  Diagnosis Date  . Hypertension   . Hyperlipidemia   . Anemia 2008    Now resolved, baseline- 12.5  . Heme positive stool 5/10    Colonoscopy 02/2011 showing only internal hemorrhoids  . Internal hemorrhoids     per colonoscopy 02/2011   Axis IV: economic problems, housing problems, occupational problems, other psychosocial or environmental problems, problems related to social environment, problems with access to health care services and problems with  primary support group Axis V: 21-30 behavior considerably influenced by delusions or hallucinations OR serious impairment in judgment, communication OR inability to function in almost all areas  Past Medical History:  Past Medical History  Diagnosis Date  . Hypertension   . Hyperlipidemia   . Anemia 2008    Now resolved, baseline- 12.5  . Heme positive stool 5/10    Colonoscopy 02/2011 showing only internal hemorrhoids  . Internal hemorrhoids     per colonoscopy 02/2011    Past Surgical History  Procedure Laterality Date  . Knee surgery    . Cesarean section      x2, 1989, 1990    Family History:  Family History  Problem Relation Age of Onset  . Hypertension Other   . Hyperlipidemia Other   . Hypertension Father   . Vision loss Father   . Vision loss Paternal Grandmother     Social History:  reports that she has never smoked. She has never used smokeless tobacco. She reports that she drinks alcohol. She reports that she does not use illicit drugs.  Additional Social History:     CIWA: CIWA-Ar BP: 189/93 mmHg Pulse Rate: 60 COWS:    PATIENT STRENGTHS: (choose at least two) Average or above average intelligence Capable of independent living  Allergies: No Known Allergies  Home Medications:  (Not in a hospital admission)  OB/GYN Status:  Patient's last menstrual period was 12/05/2009.  General Assessment Data [facility]  Is this a Tele or Face-to-Face Assessment?: Tele Assessment Is this an Initial Assessment or a Re-assessment for this encounter?: Initial Assessment Living Arrangements: Alone Can pt return to current living arrangement?: Yes Admission Status: Voluntary Is patient capable of signing voluntary admission?: Yes Transfer from: Home Referral Source: Psychiatrist  Medical Screening Exam [facility] Walk-in ONLY) Medical Exam completed: Yes  [facility] Crisis Care Plan Living  Arrangements: Alone Name of Psychiatrist: NA Name of Therapist: NA  Education Status Is patient currently in school?: No Current Grade: NA Highest grade of school patient has completed: NA Name of school: NA Contact person: NA  Risk to self with the past 6 months Suicidal Ideation: No Suicidal Intent: No Is patient at risk for suicide?: No Suicidal Plan?: No Access to Means: No What has been your use of drugs/alcohol within the last 12 months?: NA Previous Attempts/Gestures: No How many times?: 0 Other Self Harm Risks: NA Triggers for Past Attempts: Other (Comment) (Moved into a hotel; Has not received child support check.  P) Intentional Self Injurious Behavior: None Family Suicide History: No Recent stressful life event(s): Job Loss, Financial Problems, Other (Comment) (Recently moved into a hotel due to poor living standards ) Persecutory voices/beliefs?: Yes Depression: Yes Depression Symptoms: Despondent, Isolating, Guilt, Loss of interest in usual pleasures, Feeling angry/irritable Substance abuse history and/or treatment for substance abuse?: No Suicide prevention information given to non-admitted patients: Not applicable  Risk to Others within the past 6 months Homicidal Ideation: No Thoughts of Harm to Others: No Current Homicidal Intent: No Current Homicidal Plan: No Access to Homicidal Means: No Identified Victim: None Reported History of harm to others?: No Assessment of Violence: None Noted Violent Behavior Description: None Reported Does patient have access to weapons?: No Criminal Charges Pending?: No Does patient have a court date: No  Psychosis Hallucinations: Auditory, Visual Delusions: Grandiose (Believes that she runs several companies.)  Mental Status Report Appear/Hygiene: Disheveled Eye Contact: Fair Motor Activity: Freedom of movement Speech: Logical/coherent Level of Consciousness: Alert, Restless Mood: Suspicious Affect:  Anxious Anxiety Level: Minimal Thought Processes: Flight of Ideas Judgement: Unimpaired Orientation: Person, Place, Time, Situation Obsessive Compulsive Thoughts/Behaviors: None  Cognitive Functioning Concentration: Decreased Memory: Recent Impaired, Remote Impaired Insight: Fair Impulse Control: Fair Appetite: Fair Weight Loss: 0 Weight Gain: 0 Sleep: Decreased Total Hours of Sleep: 4 Vegetative Symptoms: Staying in bed, Decreased grooming  ADLScreening [facility] Assessment Services) Patient's cognitive ability adequate to safely complete daily activities?: Yes Patient able to express need for assistance with ADLs?: Yes Independently performs ADLs?: Yes (appropriate for developmental age)  Prior Inpatient Therapy Prior Inpatient Therapy: No Prior Therapy Dates: NA Prior Therapy Facilty/Provider(s): NA Reason for Treatment: NA  Prior Outpatient Therapy Prior Outpatient Therapy: No Prior Therapy Dates: NA Prior Therapy Facilty/Provider(s): NA Reason for Treatment: NA  ADL Screening (condition at time of admission) Patient's cognitive ability adequate to safely complete daily activities?: Yes Patient able to express need for assistance with ADLs?: Yes Independently performs ADLs?: Yes (appropriate for developmental age)             Advance Directives (For Healthcare) Does patient have an advance directive?: No Would patient like information on creating an advanced directive?: Yes English as a second language teacher given    Additional Information 1:1 In Past 12 Months?: No CIRT Risk: No Elopement Risk: No Does patient have medical clearance?: Yes     Disposition: Pending psych disposition.  Disposition Initial Assessment Completed for this Encounter: Yes Disposition of Patient: Other dispositions Other disposition(s):  Other (Comment)  Phillip HealStevenson, Grainne Knights [place] 06/27/2014 1:45 PM

## 2014-06-27 NOTE — ED Notes (Signed)
Patient transported to CT 

## 2014-06-27 NOTE — ED Notes (Signed)
PT reports for the last four months she feels like food is stuck in throat.  Pt also reports a Hx of HTN but stopped her meds because of cost. Last taken HTN meds 1 year ago.[facility]

## 2014-06-27 NOTE — Progress Notes (Addendum)
SW can't look for placement until EDP notes are in pt's chart.   Derrell Lollingoris Ellison Leisure, MSW  Social Worker [phone]

## 2014-06-27 NOTE — Tx Team (Signed)
Initial Interdisciplinary Treatment Plan   PATIENT STRESSORS: Financial difficulties Health problems Medication change or noncompliance   PROBLEM LIST: Problem List/Patient Goals Date to be addressed Date deferred Reason deferred Estimated date of resolution  A and V Hallucination      delusional      Noncompliant with meds                                           DISCHARGE CRITERIA:  Adequate post-discharge living arrangements Improved stabilization in mood, thinking, and/or behavior Verbal commitment to aftercare and medication compliance  PRELIMINARY DISCHARGE PLAN: Participate in Griffin therapy Placement in alternative living arrangements  PATIENT/FAMIILY INVOLVEMENT: This treatment plan has been presented to and reviewed with Miranda patient, Miranda Griffin, and/or [name], Miranda Griffin have been given Miranda opportunity to ask questions and make suggestions.  JEHU-APPIAH, Marlynn Hinckley K 06/27/2014, 8:50 PM

## 2014-06-27 NOTE — ED Provider Notes (Signed)
[facility] Elna BreslowEappen.  BP 189/93 mmHg  Pulse 60  Temp(Src) 98.2 F (36.8 C) (Oral)  Resp 18  Ht 5\' 2"  (1.575 m)  Wt 150 lb (68.04 kg)  BMI 27.43 kg/m2  SpO2 98%  LMP 12/05/2009   Glynn OctaveStephen Selda Jalbert, MD 06/27/14 1737

## 2014-06-27 NOTE — Progress Notes (Signed)
Patient ID: Miranda Griffin, female   DOB: 12-02-1955, 58 y.o.   MRN: [id number] Admission note: D:Patient is a voluntary admission in no acute distress for A/V hallucination. Pt reports hearing voices since July of 2015 telling her "they" are going to kill her. Pt is reports voices telling her not to wash her hair which looks knotted.  Pt reports the voices trying to "suffocation" which is making her not able to swallow. Pt also reports voices performing rituals on her. Pt denies SI/HI and pain.  A: Pt admitted to unit per protocol, skin assessment and belonging search done. No skin issues noted. Consent signed by pt. Pt educated on therapeutic milieu rules. Pt was introduced to milieu by nursing staff. Fall risk safety plan explained to the patient. 15 minutes checks started for safety.  R: Pt was receptive to education. Writer offered support.

## 2014-06-27 NOTE — ED Provider Notes (Signed)
CSN: [id number]     Arrival date & time 06/27/14  [id number] History  This chart was scribed for non-physician practitioner, Harle BattiestElizabeth Lucciana Head, PA-C working with Juliet RudeNathan R. Rubin PayorPickering, MD by Greggory StallionKayla Andersen, ED scribe. This patient was seen in room TR09C/TR09C and the patient's care was started at 9:22 AM.    Chief Complaint  Patient presents with  . Sore Throat   The history is provided by the patient. No language interpreter was used.    HPI Comments: Miranda Griffin is a 58 y.o. female with history of hypertension who presents to the Emergency Department complaining of a sensation of something stuck in her throat. States she has had this feeling intermittently since 02/24/14. Reports feeling light headed and hearing voices at the time she first had the feeling. She states that she is still hearing voices but does not have any light headedness anymore. Pt hears the voices every day and states they sometimes call her name. They are not voices of people she knows and states they intermittently wake her up at night. She is currently hearing the voices and states they are telling her she is going to a evidentiary hearing. Denies SI/HI but states she hears the voices tell her they are going to murder someone. States she sometimes has difficulty speaking because it feels like something is blocking her throat. Pt was able to eat and drink okay last night with no trouble swallowing. Reports very occasional wine use. Denies cigarette or recreational drug use. Denies sore throat, chest pain, SOB, abdominal pain. Pt stopped taking her hypertension medications about one years ago due to cost.   Past Medical History  Diagnosis Date  . Hypertension   . Hyperlipidemia   . Anemia 2008    Now resolved, baseline- 12.5  . Heme positive stool 5/10    Colonoscopy 02/2011 showing only internal hemorrhoids  . Internal hemorrhoids     per colonoscopy 02/2011   Past Surgical History  Procedure Laterality Date  . Knee  surgery    . Cesarean section      x2, 1989, 1990   Family History  Problem Relation Age of Onset  . Hypertension Other   . Hyperlipidemia Other   . Hypertension Father   . Vision loss Father   . Vision loss Paternal Grandmother    History  Substance Use Topics  . Smoking status: Never Smoker   . Smokeless tobacco: Never Used  . Alcohol Use: Yes     Comment: occasional wine   OB History    No data available     Review of Systems  HENT: Negative for sore throat.   Respiratory: Negative for shortness of breath.   Cardiovascular: Negative for chest pain.  Gastrointestinal: Negative for abdominal pain.  Psychiatric/Behavioral: Positive for hallucinations. Negative for suicidal ideas.  All other systems reviewed and are negative.  Allergies  Review of patient's allergies indicates no known allergies.  Home Medications   Prior to Admission medications   Medication Sig Start Date End Date Taking? Authorizing Provider  ibuprofen (ADVIL,MOTRIN) 200 MG tablet Take 600 mg by mouth every 6 (six) hours as needed (for pain).   Yes Historical Provider, MD  Multiple Vitamin (MULTIVITAMIN WITH MINERALS) TABS tablet Take 1 tablet by mouth daily.   Yes Historical Provider, MD  lisinopril-hydrochlorothiazide (PRINZIDE,ZESTORETIC) 20-25 MG per tablet TAKE 1 TABLET BY MOUTH DAILY Patient not taking: Reported on 06/27/2014 10/06/12   Maitri S Kalia-Reynolds, DO  pravastatin (PRAVACHOL) 40 MG tablet TAKE 1 TABLET  BY MOUTH DAILY Patient not taking: Reported on 06/27/2014 10/06/12   Maitri S Kalia-Reynolds, DO  vitamin B-12 (CYANOCOBALAMIN) 100 MCG tablet Take 100 mcg by mouth daily.      Historical Provider, MD   BP 192/124 mmHg  Pulse 78  Temp(Src) 98.2 F (36.8 C) (Oral)  Resp 18  SpO2 100%  LMP 12/05/2009   Physical Exam  Constitutional: She is oriented to person, place, and time. She appears well-developed and well-nourished. No distress.  HENT:  Head: Normocephalic and atraumatic.   Mouth/Throat: Oropharynx is clear and moist. No oropharyngeal exudate or posterior oropharyngeal erythema.  Eyes: Conjunctivae and EOM are normal. Pupils are equal, round, and reactive to light.  Neck: Neck supple. No tracheal deviation present. No thyromegaly present.  No palpable mass.  Cardiovascular: Normal rate, regular rhythm and normal heart sounds.  Exam reveals no gallop and no friction rub.   No murmur heard. Pulmonary/Chest: Effort normal and breath sounds normal. No respiratory distress. She has no wheezes. She has no rhonchi. She has no rales.  Abdominal: Soft. There is no tenderness.  Musculoskeletal: Normal range of motion.  Lymphadenopathy:    She has no cervical adenopathy.  Neurological: She is alert and oriented to person, place, and time. No cranial nerve deficit.  Skin: Skin is warm and dry.  Psychiatric: She has a normal mood and affect. Her behavior is normal. She expresses no homicidal and no suicidal ideation.  Nursing note and vitals reviewed.   ED Course  Procedures (including critical care time)  DIAGNOSTIC STUDIES: Oxygen Saturation is 100% on RA, normal by my interpretation.    COORDINATION OF CARE: 9:31 AM-Discussed treatment plan which includes lab work and psych consult with pt at bedside and pt agreed to plan.   Labs Review Labs Reviewed - No data to display  Imaging Review No results found.   EKG Interpretation None      MDM   Final diagnoses:  None   58 yo with reports of auditory hallucinations since July, also with sensation of foreign body in throat, no difficulty with eating or drinking and airway is intact.  Psych labs and HCTZ for elevated blood pressure without chest pain or shortness of breath.  Gi cocktail and PPI for foreign body sensation. Discussed case with Dr. Rubin PayorPickering. Will CT head for new hallucinations never before documented.  Hand-off report [facility]n Szekalski, PA-C for care to be assumed in Pod A.  Discussed  plan to do work-up with pt and pt aware and in agreement.  I personally performed the services described in this documentation, which was scribed in my presence. The recorded information has been reviewed and is accurate.  Filed Vitals:   06/27/14 0840  BP: 192/124  Pulse: 78  Temp: 98.2 F (36.8 C)  TempSrc: Oral  Resp: 18  SpO2: 100%   Meds given in ED:  Medications  gi cocktail (Maalox,Lidocaine,Donnatal) (not administered)  pantoprazole (PROTONIX) EC tablet 40 mg (not administered)  hydrochlorothiazide (HYDRODIURIL) tablet 25 mg (not administered)    New Prescriptions   No medications on file        Harle BattiestElizabeth Joyanna Kleman, NP 06/27/14 1847

## 2014-06-27 NOTE — ED Provider Notes (Signed)
12:51 PM Patient signed out to me by Donetta PottsBeth Tysinger, NP. Patient's labs and head CT unremarkable for acute changes. Patient will have TTS consult, as her hallucinations are likely not due to a medical cause.   Results for orders placed or performed during the hospital encounter of 06/27/14  CBC WITH DIFFERENTIAL  Result Value Ref Range   WBC 4.3 4.0 - 10.5 K/uL   RBC 4.83 3.87 - 5.11 MIL/uL   Hemoglobin 14.0 12.0 - 15.0 g/dL   HCT 16.141.4 09.636.0 - 04.546.0 %   MCV 85.7 78.0 - 100.0 fL   MCH 29.0 26.0 - 34.0 pg   MCHC 33.8 30.0 - 36.0 g/dL   RDW 40.913.4 81.111.5 - 91.415.5 %   Platelets 194 150 - 400 K/uL   Neutrophils Relative % 58 43 - 77 %   Neutro Abs 2.5 1.7 - 7.7 K/uL   Lymphocytes Relative 32 12 - 46 %   Lymphs Abs 1.4 0.7 - 4.0 K/uL   Monocytes Relative 9 3 - 12 %   Monocytes Absolute 0.4 0.1 - 1.0 K/uL   Eosinophils Relative 0 0 - 5 %   Eosinophils Absolute 0.0 0.0 - 0.7 K/uL   Basophils Relative 1 0 - 1 %   Basophils Absolute 0.0 0.0 - 0.1 K/uL  Comprehensive metabolic panel  Result Value Ref Range   Sodium 142 137 - 147 mEq/L   Potassium 3.5 (L) 3.7 - 5.3 mEq/L   Chloride 103 96 - 112 mEq/L   CO2 24 19 - 32 mEq/L   Glucose, Bld 92 70 - 99 mg/dL   BUN 14 6 - 23 mg/dL   Creatinine, Ser 7.820.91 0.50 - 1.10 mg/dL   Calcium 9.8 8.4 - 95.610.5 mg/dL   Total Protein 8.5 (H) 6.0 - 8.3 g/dL   Albumin 4.1 3.5 - 5.2 g/dL   AST 16 0 - 37 U/L   ALT 12 0 - 35 U/L   Alkaline Phosphatase 111 39 - 117 U/L   Total Bilirubin 0.3 0.3 - 1.2 mg/dL   GFR calc non Af Amer 69 (L) >90 mL/min   GFR calc Af Amer 80 (L) >90 mL/min   Anion gap 15 5 - 15  Ethanol  Result Value Ref Range   Alcohol, Ethyl (B) <11 0 - 11 mg/dL   Ct Head Wo Contrast  06/27/2014   CLINICAL DATA:  58 year old female with altered mental status, lightheadedness, hearing voices. Current history of hypertension.  EXAM: CT HEAD WITHOUT CONTRAST  TECHNIQUE: Contiguous axial images were obtained from the base of the skull through the vertex  without intravenous contrast.  COMPARISON:  None.  FINDINGS: Visualized paranasal sinuses and mastoids are clear. No osseous abnormality identified. Visualized orbits and scalp soft tissues are within normal limits.  Cerebral volume is within normal limits for age. No midline shift, ventriculomegaly, mass effect, evidence of mass lesion, intracranial hemorrhage or evidence of cortically based acute infarction. Gray-white matter differentiation is within normal limits throughout the brain. No suspicious intracranial vascular hyperdensity.  IMPRESSION: Normal noncontrast CT appearance of the brain.   Electronically Signed   By: Augusto GambleLee  Hall M.D.   On: 06/27/2014 11:36      Emilia BeckKaitlyn Kowen Kluth, PA-C 06/28/14 [id number]

## 2014-06-27 NOTE — ED Notes (Signed)
TTS at bedside. 

## 2014-06-27 NOTE — BH Assessment (Signed)
Per Conrasd, NP - patient meets criteria for inpatient hospitalization.  CSW Tyler Aas(Doris) will look for placement.  [facility]

## 2014-06-27 NOTE — ED Notes (Signed)
Patient denies SI/HI

## 2014-06-27 NOTE — Progress Notes (Signed)
D: Pt denies SI/HI/AVH. Pt is pleasant and cooperative. Pt stated she was good, a little tired and wanted to sleep.   A: Pt was offered support and encouragement. Pt was given scheduled medications. Pt was encourage to attend groups. Q 15 minute checks were done for safety.   R: Pt is taking medication. Pt has no complaints at this time.Pt receptive to treatment and safety maintained on unit.

## 2014-06-28 MED ORDER — RISPERIDONE 3 MG PO TABS
3.0000 mg | ORAL_TABLET | Freq: Every day | ORAL | Status: DC
  Administered 2014-06-28: 3 mg via ORAL
  Filled 2014-06-28: qty 1
  Filled 2014-06-28: qty 3
  Filled 2014-06-28: qty 1

## 2014-06-28 NOTE — Tx Team (Signed)
Interdisciplinary Treatment Plan Update (Adult)   Date: 06/28/2014 Time Reviewed:10:02AM Progress in Treatment:  Attending groups: No (new to unit)  Participating in groups:  No  Taking medication as prescribed: Yes  Tolerating medication: Yes  Family/Significant othe contact made: No. Pt denies SI upon admission.   Patient understands diagnosis: Yes, AEB seeking treatment for AH, mood stabilization, and for med management.  Discussing patient identified problems/goals with staff: Yes  Medical problems stabilized or resolved: Yes  Denies suicidal/homicidal ideation: Yes during admission/self report.  Patient has not harmed self or Others: Yes  New problem(s) identified:  Discharge Plan or Barriers: Pt new to unit. CSW assessing.  Additional comments: Miranda Griffin is a 58 year old African American female that reports hearing voices of people cursing at her. Patient reports that the voices are curing her family. Patient reports that the voices are not telling her to harm herself or others. Patient reports that she has been hearing these voices for the past month. Patient reports that she hears the voices every day and states they sometimes call her name. Patient reports that they are not voices of people she knows and states they intermittently wake her up at night. Documentation in epic reports that that the patient stated that the voices tell her they are going to murder someone. Patient reports that she has experiences stress related to substandard living conditions at her apartment. Patient reports that she has to move out last Wednesday to [facility] [employer]. Patient denies a prior history of psychiatric hospitalization. Patient denies prior mental health treatment. Patient denies substance abuse. Patient BAL is <11 and there is not a UDS completed on the patient. Patient denies SI and HI. Patient brought herself to the ED initially complaining of a sensation of something stuck in her  throat. Patient reports that she has had this feeling intermittently since 02/24/14. Patient reports that she has difficulty speaking because it feels like something is blocking her throat.. Reason for Continuation of Hospitalization: Psychosis-AH Mood stabilization Medication management  Estimated length of stay: 5-7 days  For review of initial/current patient goals, please see plan of care.  Attendees:  Patient:    Family:    Physician: Dr. Elna BreslowEappen, MD 06/28/2014 10:02 AM   Nursing: Nancy MarusMarian, Brittany, Britney RN 06/28/2014 10:02 AM   Clinical Social Worker Denice Cardon Smart, LCSWA  06/28/2014 10:02 AM   Other: Daryel Geraldodney North, LCSW 06/28/2014 10:02 AM   Other: Darden DatesJennifer C. Nurse CM 06/28/2014 10:02 AM   Other: Santa GeneraAnne Cunningham, LCSW  06/28/2014 10:02 AM   Other:    Scribe for Treatment Team:  The Sherwin-WilliamsHeather Smart LCSWA  06/28/2014 10:02 AM

## 2014-06-28 NOTE — Progress Notes (Signed)
The focus of this group is to help patients review their daily goal of treatment and discuss progress on daily workbooks. Pt attended the evening group session and responded to all discussion prompts from the Writer. Pt shared that today was a good day. "Today we had good groups and good food." Pt told the Writer about several of the groups she attended and that she enjoyed the speakers. She also told the group about her children, whom she was missing. Pt's only additional request from Nursing Staff this evening was to receive towels and to shave. Pt's affect was appropriate during group.

## 2014-06-28 NOTE — [facility] Suicide Risk Assessment (Addendum)
   Nursing information obtained from:  Patient Demographic factors:  Low socioeconomic status, Living alone, Unemployed Current Mental Status:  NA Loss Factors:  Financial problems / change in socioeconomic status Historical Factors:  NA Risk Reduction Factors:  Sense of responsibility to family, Positive social support Total Time spent with patient: 45 minutes  CLINICAL FACTORS:   Psychosis  Psychiatric Specialty Exam: Physical Exam  ROS  Blood pressure 129/73, pulse 68, temperature 98 F (36.7 C), temperature source Oral, resp. rate 20, height 5' 1.5" (1.562 m), weight 71.215 kg (157 lb), last menstrual period 12/05/2009, SpO2 100 %.Body mass index is 29.19 kg/(m^2).  SEE ADMIT NOTE MSE   COGNITIVE FEATURES THAT CONTRIBUTE TO RISK:  Closed-mindedness/ poor insight into illness    SUICIDE RISK:   Moderate:  Frequent suicidal ideation with limited intensity, and duration, some specificity in terms of plans, no associated intent, good self-control, limited dysphoria/symptomatology, some risk factors present, and identifiable protective factors, including available and accessible social support.  PLAN OF CARE: Patient will be admitted to inpatient psychiatric unit for stabilization and safety. Will provide and encourage milieu participation. Provide medication management and maked adjustments as needed.  Will follow daily.    I certify that inpatient services furnished can reasonably be expected to improve the patient's condition.  Liseth Wann, Madaline GuthrieFERNANDO 06/28/2014, 6:44 PM

## 2014-06-28 NOTE — [facility] Group Notes (Signed)
[facility] LCSW Group Therapy  06/28/2014 , 11:22 AM   Type of Therapy:  Group Therapy  Participation Level:  Active  Participation Quality:  Attentive  Affect:  Appropriate  Cognitive:  Alert  Insight:  Improving  Engagement in Therapy:  Engaged  Modes of Intervention:  Discussion, Exploration and Socialization  Summary of Progress/Problems: Today's group focused on the term Diagnosis.  Participants were asked to define the term, and then pronounce whether it is a negative, positive or neutral term.  Miranda Griffin talked about not feeling depressed, but hopeless.  She had a difficult time explaining the difference between the 2.  Identified multiple stressors; children aging out of the home, unemployment, housing situation.  Was unable to identify anything that is of help to her currently.  Miranda Griffin, Miranda Griffin 06/28/2014 , 11:22 AM

## 2014-06-28 NOTE — Progress Notes (Signed)
D: Patient denies SI/HI and visual hallucinations; patient reports auditory patient reports sleep is fair; reports appetite is fair; reports energy level is low ; reports ability to concentrate is good; rates depression as 7/10; rates hopelessness 7/10; rates anxiety as 5/10; patient reports pain in her back; patient reports that [facility] up; patient concerns were addressed; physician made aware  A: Monitored q 15 minutes; patient encouraged to attend groups; patient educated about medications; patient given medications per physician orders; patient encouraged to express feelings and/or concerns  R: Patient coherency is circumstantial and when asking her about the auditory hallucinations she says they tell her about  doing different things but they are not telling her to harm herself or others.; patient's interaction with staff and peers is minimal but she has been attending group; patient was able to set goal to talk with staff 1:1 when having feelings of SI; patient is taking medications as prescribed and tolerating medications at this time
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
## 2014-06-29 LAB — HEMOGLOBIN A1C
Hgb A1c MFr Bld: 5.4 % (ref <5.7)
Mean Plasma Glucose: 108 mg/dL (ref <117)

## 2014-06-29 LAB — LIPID PANEL
Cholesterol: 246 mg/dL — ABNORMAL HIGH (ref 0–200)
HDL: 59 mg/dL (ref >39)
LDL Cholesterol: 175 mg/dL — ABNORMAL HIGH (ref 0–99)
TRIGLYCERIDES: 58 mg/dL (ref <150)
Total CHOL/HDL Ratio: 4.2 RATIO
VLDL: 12 mg/dL (ref 0–40)

## 2014-06-29 LAB — RPR

## 2014-06-29 LAB — TSH: TSH: 2.23 u[IU]/mL (ref 0.350–4.500)

## 2014-06-29 LAB — VITAMIN B12: Vitamin B-12: 1298 pg/mL — ABNORMAL HIGH (ref 211–911)

## 2014-06-29 LAB — HIV ANTIBODY (ROUTINE TESTING W REFLEX): HIV: NONREACTIVE

## 2014-06-29 LAB — FOLATE: Folate: 16.4 ng/mL

## 2014-06-29 MED ORDER — RISPERIDONE 2 MG PO TABS
2.0000 mg | ORAL_TABLET | Freq: Two times a day (BID) | ORAL | Status: DC
  Administered 2014-06-29 – 2014-07-01 (×4): 2 mg via ORAL
  Filled 2014-06-29 (×9): qty 1

## 2014-06-29 MED ORDER — CITALOPRAM HYDROBROMIDE 20 MG PO TABS
20.0000 mg | ORAL_TABLET | Freq: Every day | ORAL | Status: DC
  Administered 2014-06-29 – 2014-07-05 (×7): 20 mg via ORAL
  Filled 2014-06-29 (×11): qty 1

## 2014-06-29 NOTE — Plan of Care (Signed)
Problem: Alteration in thought process Goal: LTG-Patient has not harmed self or others in at least 2 days Outcome: Progressing AEB documentation

## 2014-06-29 NOTE — Progress Notes (Signed)
D: Pt denies SI/HI/AVH. Pt is pleasant and cooperative. Pt stated she has to be doing something later, maybe going to scheel.   A: Pt was offered support and encouragement. Pt was given scheduled medications. Pt was encourage to attend groups. Q 15 minute checks were done for safety.   R:Pt attends groups and interacts well with peers and staff. Pt is taking medication. Pt has no complaints at this time Pt receptive to treatment and safety maintained on unit.

## 2014-06-29 NOTE — Progress Notes (Signed)
D: Pt denies SI/HI/AVH. Pt appears to be responding to internal stimuli.  Pt is pleasant and cooperative. Pt believes medications are making her drowsy during the day,  A: Pt was offered support and encouragement. Pt was given scheduled medications. Pt was encourage to attend groups. Q 15 minute checks were done for safety.   R:Pt attends groups and interacts well with peers and staff. Pt is taking medication. Pt has no complaints.Pt receptive to treatment and safety maintained on unit.

## 2014-06-29 NOTE — [facility] Group Notes (Signed)
[facility] LCSW Group Therapy  06/29/2014 1:27 PM  Type of Therapy:  Group Therapy  Participation Level:  Minimal  Participation Quality:  Drowsy  Affect:  Depressed and Flat  Cognitive:  Lacking  Insight:  Limited  Engagement in Therapy:  None  Modes of Intervention:  Discussion, Education, Exploration, Limit-setting, Problem-solving, Rapport Building, Socialization and Support  Summary of Progress/Problems: Resilience and Vulnerability: Group members were asked to process what resilience and vulnerability mean and identify how resilience and vulnerability impact their lives. Group members were asked to provide examples of resilience and vulnerability. Miranda Griffin left group after five minutes stating that she did not feel well and needed to go to bed. She did not contribute to group discussion and at this time, does not demonstrate improving insight or progress in the group setting.  Smart, Miranda Griffin LCSWA 06/29/2014, 1:27 PM

## 2014-06-29 NOTE — [facility] Suicide Risk Assessment (Signed)
[facility] INPATIENT:  Family/Significant Other Suicide Prevention Education  Suicide Prevention Education:  Education Completed; Miranda Griffin (pt's son) [phone] has been identified by the patient as the family member/significant other with whom the patient will be residing, and identified as the person(s) who will aid the patient in the event of a mental health crisis (suicidal ideations/suicide attempt).  With written consent from the patient, the family member/significant other has been provided the following suicide prevention education, prior to the and/or following the discharge of the patient.  The suicide prevention education provided includes the following:  Suicide risk factors  Suicide prevention and interventions  National Suicide Hotline telephone number  [facility] assessment telephone number  [facility] Assistance 911  [facility] and/or [facility] telephone number  Request made of family/significant other to:  Remove weapons (e.g., guns, rifles, knives), all items previously/currently identified as safety concern.    Remove drugs/medications (over-the-counter, prescriptions, illicit drugs), all items previously/currently identified as a safety concern.  The family member/significant other verbalizes understanding of the suicide prevention education information provided.  The family member/significant other agrees to remove the items of safety concern listed above.  Miranda Griffin, Miranda Griffin LCSWA 06/29/2014, 10:20 AM

## 2014-06-29 NOTE — Progress Notes (Signed)
Did not attended group 

## 2014-06-29 NOTE — [facility] Group Notes (Signed)
[facility]   06/29/2014 10:10 AM  Participation Quality:  Invited.  Chose to not attend    Kiribatiorth, Thereasa Distanceodney B

## 2014-06-29 NOTE — Progress Notes (Signed)
[facility] MD Progress Note  14/11/3152 00:86 AM Miranda Griffin  MRN:  [id number] Subjective:  Patient states that she is still hearing voices, but that they have become less intense . She does continue to describe somatic symptoms, and states she feels that food gets stuck and somehow travels around her thorax.  She states medication is well tolerated, but does feel " tired" on it. Objective: I have discussed case with nursing staff and met with patient. She tends to remain isolative in room, but is pleasant and cooperative upon approach. She seems less focused on delusional material she described yesterday, but does state she continues to have hallucinations . She denies command hallucinations, but states the voices tell her " you are never going to leave this place". She also reports she hears her own voice echoing around the room.  She does not seem visibly internally preoccupied at this time. She is describing some sedation from Risperidone, but is fully alert and attentive at this time. No dystonia , no akathisia. Labs reviewed- hypercholesterolemia.   Diagnosis:  Psychosis NOS  Total Time spent with patient: 25 minutes    ADL's:  Fair   Sleep: Good   Appetite:  Good   Suicidal Ideation:  Denies any suicidal ideations at this time Homicidal Ideation:  Denies any homicidal ideations AEB (as evidenced by):  Psychiatric Specialty Exam: Physical Exam  Review of Systems  Constitutional: Negative for fever and chills.  Eyes: Negative.        Today does not report blurry vision or visual difficulties   Respiratory: Negative for cough and shortness of breath.   Cardiovascular: Negative for chest pain.  Gastrointestinal: Negative for nausea, vomiting and abdominal pain.  Genitourinary: Negative.   Skin: Negative for rash.  Neurological: Negative for seizures and loss of consciousness.  Psychiatric/Behavioral: Positive for hallucinations.    Blood pressure 112/58, pulse 78,  temperature 97.7 F (36.5 C), temperature source Oral, resp. rate 20, height 5' 1.5" (1.562 m), weight 71.215 kg (157 lb), last menstrual period 12/05/2009, SpO2 100 %.Body mass index is 29.19 kg/(m^2).  General Appearance: Fairly Groomed  Engineer, water::  Good  Speech:  Slow  Volume:  Decreased  Mood:  although denies depression, does seem somewhat sad and constricted in affect  Affect:  Constricted and but does smile briefly at times, appropriately  Thought Process:  Goal Directed and Linear  Orientation:  Other:  fully alert and attentive  Thought Content:  ongoing hallucinations, less focused on delusional material/thoughts  Suicidal Thoughts:  No- at this time denies any suicidal ideations  Homicidal Thoughts:  No  Memory:  recent and remote grossly intact   Judgement:  Impaired  Insight:  Lacking  Psychomotor Activity:  Decreased  Concentration:  Good  Recall:  Good  Fund of Knowledge:Good  Language: Good  Akathisia:  No  Handed:  Right  AIMS (if indicated):     Assets:  Desire for Improvement Resilience  Sleep:  Number of Hours: 6.5   Musculoskeletal: Strength & Muscle Tone: within normal limits Gait & Station: normal Patient leans: N/A  Current Medications: Current Facility-Administered Medications  Medication Dose Route Frequency Provider Last Rate Last Dose  . acetaminophen (TYLENOL) tablet 650 mg  650 mg Oral Q6H PRN Laverle Hobby, PA-C   650 mg at 06/28/14 0743  . alum & mag hydroxide-simeth (MAALOX/MYLANTA) 200-200-20 MG/5ML suspension 30 mL  30 mL Oral Q4H PRN Laverle Hobby, PA-C      . hydrALAZINE (APRESOLINE) tablet  10 mg  10 mg Oral 3 times per day Laverle Hobby, PA-C   10 mg at 06/28/14 2201  . hydrochlorothiazide (HYDRODIURIL) tablet 25 mg  25 mg Oral Daily Saramma Eappen, MD   25 mg at 06/29/14 0813  . hydrOXYzine (ATARAX/VISTARIL) tablet 25 mg  25 mg Oral Q6H PRN Laverle Hobby, PA-C   25 mg at 06/27/14 2209  . ibuprofen (ADVIL,MOTRIN) tablet 600 mg   600 mg Oral Q6H PRN Laverle Hobby, PA-C      . Influenza vac split quadrivalent PF (FLUARIX) injection 0.5 mL  0.5 mL Intramuscular Tomorrow-1000 Saramma Eappen, MD      . lisinopril (PRINIVIL,ZESTRIL) tablet 20 mg  20 mg Oral Daily Ursula Alert, MD   20 mg at 06/29/14 0813  . magnesium hydroxide (MILK OF MAGNESIA) suspension 30 mL  30 mL Oral Daily PRN Laverle Hobby, PA-C      . multivitamin with minerals tablet 1 tablet  1 tablet Oral Daily Laverle Hobby, PA-C   1 tablet at 06/29/14 0813  . pravastatin (PRAVACHOL) tablet 40 mg  40 mg Oral q1800 Laverle Hobby, PA-C   40 mg at 06/28/14 1723  . risperiDONE (RISPERDAL) tablet 3 mg  3 mg Oral QHS Jenne Campus, MD   3 mg at 06/28/14 2201  . traZODone (DESYREL) tablet 50 mg  50 mg Oral QHS,MR X 1 Laverle Hobby, PA-C   50 mg at 06/28/14 2201    Lab Results:  Results for orders placed or performed during the hospital encounter of 06/27/14 (from the past 48 hour(s))  HIV antibody     Status: None   Collection Time: 06/28/14  7:42 PM  Result Value Ref Range   HIV 1&2 Ab, 4th Generation NONREACTIVE NONREACTIVE    Comment: (NOTE) A NONREACTIVE HIV Ag/Ab result does not exclude HIV infection since the time frame for seroconversion is variable. If acute HIV infection is suspected, a HIV-1 RNA Qualitative TMA test is recommended. HIV-1/2 Antibody Diff         Not indicated. HIV-1 RNA, Qual TMA           Not indicated. PLEASE NOTE: This information has been disclosed to you from records whose confidentiality may be protected by state law. If your state requires such protection, then the state law prohibits you from making any further disclosure of the information without the specific written consent of the person to whom it pertains, or as otherwise permitted by law. A general authorization for the release of medical or other information is NOT sufficient for this purpose. The performance of this assay has not been clinically validated  in patients less than 22 years old. Performed at [facility]   Vitamin B12     Status: Abnormal   Collection Time: 06/29/14  6:45 AM  Result Value Ref Range   Vitamin B-12 1298 (H) 211 - 911 pg/mL    Comment: Performed at [facility]  Folate     Status: None   Collection Time: 06/29/14  6:45 AM  Result Value Ref Range   Folate 16.4 ng/mL    Comment: (NOTE) Reference Ranges        Deficient:       0.4 - 3.3 ng/mL        Indeterminate:   3.4 - 5.4 ng/mL        Normal:              > 5.4  ng/mL Performed at [facility]     Status: Abnormal   Collection Time: 06/29/14  6:45 AM  Result Value Ref Range   Cholesterol 246 (H) 0 - 200 mg/dL   Triglycerides 58 <150 mg/dL   HDL 59 >39 mg/dL   Total CHOL/HDL Ratio 4.2 RATIO   VLDL 12 0 - 40 mg/dL   LDL Cholesterol 175 (H) 0 - 99 mg/dL    Comment:        Total Cholesterol/HDL:CHD Risk Coronary Heart Disease Risk Table                     Men   Women  1/2 Average Risk   3.4   3.3  Average Risk       5.0   4.4  2 X Average Risk   9.6   7.1  3 X Average Risk  23.4   11.0        Use the calculated Patient Ratio above and the CHD Risk Table to determine the patient's CHD Risk.        ATP III CLASSIFICATION (LDL):  <100     mg/dL   Optimal  100-129  mg/dL   Near or Above                    Optimal  130-159  mg/dL   Borderline  160-189  mg/dL   High  >190     mg/dL   Very High Performed at [facility]     Physical Findings: AIMS: Facial and Oral Movements Muscles of Facial Expression: None, normal Lips and Perioral Area: None, normal Jaw: None, normal Tongue: None, normal,Extremity Movements Upper (arms, wrists, hands, fingers): None, normal Lower (legs, knees, ankles, toes): None, normal, Trunk Movements Neck, shoulders, hips: None, normal, Overall Severity Severity of abnormal movements (highest score from questions above): None, normal Incapacitation due to abnormal  movements: None, normal Patient's awareness of abnormal movements (rate only patient's report): No Awareness, Dental Status Current problems with teeth and/or dentures?: Yes (chipped tooth lower) Does patient usually wear dentures?: No  CIWA:    COWS:      Assessment: Patient is somewhat improved compared to yesterday. She is less focused on delusional thoughts , although still psychotic and reporting ongoing hallucinations. Tolerating Risperidone well thus far except for some sedation. Although she minimizes significant depression, she does present with some sad affect, critical hallucinations, and subdued speech and some degree of psychomotor retardation. I think an antidepressant medication trial is warranted, in addition to antipsychotic trial.   Treatment Plan Summary: Daily contact with patient to assess and evaluate symptoms and progress in treatment Medication management See below  Plan: Continue inpatient treatment, milieu, support Continue Risperidone at 2 mgrs BID Start Celexa 20 mgrs QDAY   Medical Decision Making Problem Points:  Established problem, stable/improving (1), Review of last therapy session (1) and Review of psycho-social stressors (1) Data Points:  Review or order clinical lab tests (1) Review of medication regiment & side effects (2) Review of new medications or change in dosage (2)  I certify that inpatient services furnished can reasonably be expected to improve the patient's condition.   COBOS, FERNANDO 06/29/2014, 11:44 AM

## 2014-06-29 NOTE — Progress Notes (Signed)
D: Pt reports auditory hallucinations telling her to kill herself. Pt denies SI/HI at this time. Pt presents with flat affect and depressed mood. Pt has minimal interaction on the milieu. Pt has been cooperative with attending groups and taking meds. No adverse reactions to meds verbalized my pt at this time.  A:Medications administered as ordered per MD. Verbal support given. Pt encouraged to attend groups. 15 minute checks performed for safety.  R: Pt safety maintained at this time. Pt receptive to treatment.

## 2014-06-30 NOTE — Progress Notes (Signed)
D Pt. Was resting quietly in her bed during the assessment.  Pt. Denies SI and HI, no complaints of pain or discomfort noted at this time.  A Writer offered support and encouragement, discussed coping skills with pt.  R Pt. Remains safe on the unit,  Pt. States that she only hear the voices that we hear, rates her depression and anxiety both at a 0.

## 2014-06-30 NOTE — Progress Notes (Signed)
Patient ID: Miranda Griffin, female   DOB: 03-16-1956, 58 y.o.   MRN: [id number]  [facility]  (Nursing/MHT/Case Management/Adjunct)  Date:  06/30/2014  Time:  9:35 AM  Type of Therapy:  Nurse Education  Participation Level:  Did Not Attend  Participation Quality:  Did not attend  Affect:  Did not attend  Cognitive:  Did not attend  Insight:  None  Engagement in Group:  None  Modes of Intervention:  Discussion and Education  Summary of Progress/Problems: Patient was invited to group but did not attend.

## 2014-06-30 NOTE — Progress Notes (Signed)
Patient ID: Miranda Griffin, female   DOB: 1956/04/05, 58 y.o.   MRN: [id number] [facility] MD Progress Note  27/02/4127 7:86 PM Miranda Griffin  MRN:  [id number] Subjective:  Patient states she is feeling better and that her  Symptoms (dysphagia type symptoms and auditory hallucinations)  are improving . Objective: I have discussed case with nursing staff and met with patient. Report from staff is that patient recently had flu vaccination done on unit and attributes her current improvement to said injection. Today, she does endorse she feels medications are helping and asked whether she needed to continue taking them after discharge- she seemed interested and receptive to psycho-education about importance of medication and follow up compliance after discharge. As per staff, still isolative, but did go to one group. Still appearing internally preoccupied at times, but improved overall. As per staff, patient's son was contacted, and reported that patient's psychotic Miranda Griffin changes are not new and have probably been going on for at least a few months. She states she has had no hallucinations today, and does not seem internally preoccupied at this time Today she is not endorsing sedation or other side effects from medication Labs reviewed as below  Diagnosis:  Psychosis NOS  Total Time spent with patient: 20 minutes    ADL's: improved  Sleep: Good   Appetite:  Good   Suicidal Ideation:  Denies any suicidal ideations at this time Homicidal Ideation:  Denies any homicidal ideations AEB (as evidenced by):  Psychiatric Specialty Exam: Physical Exam  Review of Systems  Constitutional: Negative for fever and chills.  Eyes: Negative.        Today does not report blurry vision or visual difficulties   Respiratory: Negative for cough and shortness of breath.   Cardiovascular: Negative for chest pain.  Gastrointestinal: Negative for nausea, vomiting and abdominal pain.  Genitourinary:  Negative.   Skin: Negative for rash.  Neurological: Negative for seizures and loss of consciousness.  Psychiatric/Behavioral: Positive for hallucinations.    Blood pressure 128/77, pulse 72, temperature 97.8 F (36.6 C), temperature source Oral, resp. rate 20, height 5' 1.5" (1.562 m), weight 71.215 kg (157 lb), last menstrual period 12/05/2009, SpO2 100 %.Body mass index is 29.19 kg/(m^2).  General Appearance: improved grooming  Eye Contact::  Good- she is polite and pleasant upon approach  Speech:  Slow  Volume:  Decreased  Mood:  describes mood as "OK"  Affect:  still somewhat constricted  Thought Process:  Goal Directed and Linear  Orientation:  Other:  fully alert and attentive  Thought Content:  hallucinations have decreased, and somatic ruminations have also improved significantly  Suicidal Thoughts:  No- at this time denies any suicidal ideations  Homicidal Thoughts:  No  Memory:  recent and remote grossly intact   Judgement:  Impaired  Insight:  Lacking  Psychomotor Activity:  Still decreased   Concentration:  Good  Recall:  Good  Fund of Knowledge:Good  Language: Good  Akathisia:  No  Handed:  Right  AIMS (if indicated):     Assets:  Desire for Improvement Resilience  Sleep:  Number of Hours: 6.75   Musculoskeletal: Strength & Muscle Tone: within normal limits Gait & Station: normal Patient leans: N/A  Current Medications: Current Facility-Administered Medications  Medication Dose Route Frequency Provider Last Rate Last Dose  . acetaminophen (TYLENOL) tablet 650 mg  650 mg Oral Q6H PRN Laverle Hobby, PA-C   650 mg at 06/28/14 0743  . alum & mag hydroxide-simeth (MAALOX/MYLANTA) 200-200-20 MG/5ML  suspension 30 mL  30 mL Oral Q4H PRN Laverle Hobby, PA-C      . citalopram (CELEXA) tablet 20 mg  20 mg Oral Daily Jenne Campus, MD   20 mg at 06/30/14 3235  . hydrALAZINE (APRESOLINE) tablet 10 mg  10 mg Oral 3 times per day Laverle Hobby, PA-C   10 mg at  06/30/14 1539  . hydrochlorothiazide (HYDRODIURIL) tablet 25 mg  25 mg Oral Daily Saramma Eappen, MD   25 mg at 06/30/14 0737  . hydrOXYzine (ATARAX/VISTARIL) tablet 25 mg  25 mg Oral Q6H PRN Laverle Hobby, PA-C   25 mg at 06/27/14 2209  . ibuprofen (ADVIL,MOTRIN) tablet 600 mg  600 mg Oral Q6H PRN Laverle Hobby, PA-C   600 mg at 06/30/14 1539  . lisinopril (PRINIVIL,ZESTRIL) tablet 20 mg  20 mg Oral Daily Ursula Alert, MD   20 mg at 06/30/14 0737  . magnesium hydroxide (MILK OF MAGNESIA) suspension 30 mL  30 mL Oral Daily PRN Laverle Hobby, PA-C      . multivitamin with minerals tablet 1 tablet  1 tablet Oral Daily Laverle Hobby, PA-C   1 tablet at 06/30/14 0737  . pravastatin (PRAVACHOL) tablet 40 mg  40 mg Oral q1800 Laverle Hobby, PA-C   40 mg at 06/30/14 1702  . risperiDONE (RISPERDAL) tablet 2 mg  2 mg Oral BID Jenne Campus, MD   2 mg at 06/30/14 1702  . traZODone (DESYREL) tablet 50 mg  50 mg Oral QHS,MR X 1 Laverle Hobby, PA-C   50 mg at 06/28/14 2201    Lab Results:  Results for orders placed or performed during the hospital encounter of 06/27/14 (from the past 48 hour(s))  HIV antibody     Status: None   Collection Time: 06/28/14  7:42 PM  Result Value Ref Range   HIV 1&2 Ab, 4th Generation NONREACTIVE NONREACTIVE    Comment: (NOTE) A NONREACTIVE HIV Ag/Ab result does not exclude HIV infection since the time frame for seroconversion is variable. If acute HIV infection is suspected, a HIV-1 RNA Qualitative TMA test is recommended. HIV-1/2 Antibody Diff         Not indicated. HIV-1 RNA, Qual TMA           Not indicated. PLEASE NOTE: This information has been disclosed to you from records whose confidentiality may be protected by state law. If your state requires such protection, then the state law prohibits you from making any further disclosure of the information without the specific written consent of the person to whom it pertains, or as otherwise  permitted by law. A general authorization for the release of medical or other information is NOT sufficient for this purpose. The performance of this assay has not been clinically validated in patients less than 75 years old. Performed at [facility]   RPR     Status: None   Collection Time: 06/29/14  6:45 AM  Result Value Ref Range   RPR NON REAC NON REAC    Comment: Performed at [facility]  Vitamin B12     Status: Abnormal   Collection Time: 06/29/14  6:45 AM  Result Value Ref Range   Vitamin B-12 1298 (H) 211 - 911 pg/mL    Comment: Performed at [facility]  Folate     Status: None   Collection Time: 06/29/14  6:45 AM  Result Value Ref Range   Folate 16.4 ng/mL  Comment: (NOTE) Reference Ranges        Deficient:       0.4 - 3.3 ng/mL        Indeterminate:   3.4 - 5.4 ng/mL        Normal:              > 5.4 ng/mL Performed at [facility]   TSH     Status: None   Collection Time: 06/29/14  6:45 AM  Result Value Ref Range   TSH 2.230 0.350 - 4.500 uIU/mL    Comment: Performed at [facility]  Lipid panel     Status: Abnormal   Collection Time: 06/29/14  6:45 AM  Result Value Ref Range   Cholesterol 246 (H) 0 - 200 mg/dL   Triglycerides 58 <150 mg/dL   HDL 59 >39 mg/dL   Total CHOL/HDL Ratio 4.2 RATIO   VLDL 12 0 - 40 mg/dL   LDL Cholesterol 175 (H) 0 - 99 mg/dL    Comment:        Total Cholesterol/HDL:CHD Risk Coronary Heart Disease Risk Table                     Men   Women  1/2 Average Risk   3.4   3.3  Average Risk       5.0   4.4  2 X Average Risk   9.6   7.1  3 X Average Risk  23.4   11.0        Use the calculated Patient Ratio above and the CHD Risk Table to determine the patient's CHD Risk.        ATP III CLASSIFICATION (LDL):  <100     mg/dL   Optimal  100-129  mg/dL   Near or Above                    Optimal  130-159  mg/dL   Borderline  160-189  mg/dL   High  >190     mg/dL   Very High Performed at  [facility]   Hemoglobin A1c     Status: None   Collection Time: 06/29/14  6:45 AM  Result Value Ref Range   Hgb A1c MFr Bld 5.4 <5.7 %    Comment: (NOTE)                                                                       According to the ADA Clinical Practice Recommendations for 2011, when HbA1c is used as a screening test:  >=6.5%   Diagnostic of Diabetes Mellitus           (if abnormal result is confirmed) 5.7-6.4%   Increased risk of developing Diabetes Mellitus References:Diagnosis and Classification of Diabetes Mellitus,Diabetes LNLG,9211,94(RDEYC 1):S62-S69 and Standards of Medical Care in         Diabetes - 2011,Diabetes Care,2011,34 (Suppl 1):S11-S61.    Mean Plasma Glucose 108 <117 mg/dL    Comment: Performed at [facility]    Physical Findings: AIMS: Facial and Oral Movements Muscles of Facial Expression: None, normal Lips and Perioral Area: None, normal Jaw: None, normal Tongue: None, normal,Extremity Movements Upper (arms, wrists, hands, fingers):  None, normal Lower (legs, knees, ankles, toes): None, normal, Trunk Movements Neck, shoulders, hips: None, normal, Overall Severity Severity of abnormal movements (highest score from questions above): None, normal Incapacitation due to abnormal movements: None, normal Patient's awareness of abnormal movements (rate only patient's report): No Awareness, Dental Status Current problems with teeth and/or dentures?: Yes (chipped tooth lower) Does patient usually wear dentures?: No  CIWA:    COWS:      Assessment: Patient continues to gradually improve, with decreased hallucinations, decreased somatic preoccupations, and a somewhat improved range of affect. Insight remains limited, but does state she will take medications as prescribed after she leaves hospital. She is tolerating medications well.   Treatment Plan Summary: Daily contact with patient to assess and evaluate symptoms and progress in  treatment Medication management See below  Plan: Continue inpatient treatment, milieu, support Continue Risperidone  2 mgrs BID Continue Celexa 20 mgrs QDAY   Medical Decision Making Problem Points:  Established problem, stable/improving (1), Review of last therapy session (1) and Review of psycho-social stressors (1) Data Points:  Review of medication regiment & side effects (2) Review of new medications or change in dosage (2)  I certify that inpatient services furnished can reasonably be expected to improve the patient's condition.   COBOS, McKean 06/30/2014, 5:17 PM

## 2014-06-30 NOTE — [facility] Group Notes (Signed)
[facility] LCSW Group Therapy  06/30/2014 1:33 PM   Type of Therapy:  Group Therapy  Participation Level:  Active  Participation Quality:  Attentive  Affect:  Appropriate  Cognitive:  Appropriate  Insight:  Improving  Engagement in Therapy:  Engaged  Modes of Intervention:  Clarification, Education, Exploration and Socialization  Summary of Progress/Problems: Today's group focused on relapse prevention.  We defined the term, and then brainstormed on ways to prevent relapse.  Gave a rather convoluted definition of relapse.  Left soon thereafter, and did not return.  Daryel Geraldorth, Jamelle Noy B 06/30/2014 , 1:33 PM

## 2014-06-30 NOTE — Progress Notes (Signed)
Did not attended group 

## 2014-06-30 NOTE — Progress Notes (Signed)
D: Pt presents less anxious this morning. Per pt, "the flu shot healed me, I'm feeling much better". Pt thoughts are disorganized and speech is tangential. Pt is denying any auditory hallucinations this morning. Pt reported that she slept well last night. Pt denies SI/HI. Pt denies feeling depressed and is cheerful on approach. Pt has minimal interaction on the unit. Pt compliant with attending groups and taking meds. No adverse reaction to meds verbalized by pt.  A: Medications administered as ordered per MD. Verbal support given. Pt encouraged to attend groups. 15 minute checks performed for safety.  R: Pt safety maintained at this time. Pt receptive to treatment.

## 2014-07-01 MED ORDER — RISPERIDONE 2 MG PO TABS
2.0000 mg | ORAL_TABLET | Freq: Every morning | ORAL | Status: DC
  Administered 2014-07-02 – 2014-07-04 (×3): 2 mg via ORAL
  Filled 2014-07-01 (×4): qty 1

## 2014-07-01 MED ORDER — RISPERIDONE 3 MG PO TABS
3.0000 mg | ORAL_TABLET | Freq: Every day | ORAL | Status: DC
  Administered 2014-07-01 – 2014-07-03 (×3): 3 mg via ORAL
  Filled 2014-07-01 (×2): qty 1
  Filled 2014-07-01 (×2): qty 3
  Filled 2014-07-01 (×2): qty 1

## 2014-07-01 NOTE — Progress Notes (Signed)
Patient ID: Miranda Griffin, female   DOB: 11/29/1955, 58 y.o.   MRN: [id number]  [facility] MD Progress Note  07/01/2014 4:45 PM Miranda Griffin  MRN:  [id number] Subjective: Patient describes rash. She states that she is feeling better, and acknowledges improvement of symptoms. Objective: I have discussed case with treatment team and met with patient. I have met patient with female RN. She does have some maculopapular lesions on forearms bilaterally ( just a few, not significantly pruriginous ) and on lateral aspect of neck. No other locations reported by patient and she does not have a diffuse rash. She has not oral/ ocular  /mucosal involvement and she does not have any respiratory symptoms. I suspect possible contact dermatitis.  Patient is improved, but is still delusional /psychotic and when asked  States that she thinks rash may be related to her " suffocation" ceremony she had described upon admission. She does state, however, that she is less concerned about this, that she does not think about it much, and that she has not heard any voices today. She is going to groups, is visible in milieu, and is not presenting with any agitation or disruptive behaviors on unit. Denies medication side effects  Diagnosis:  Psychosis NOS  Total Time spent with patient: 20 minutes    ADL's: improved  Sleep: Good   Appetite:  Good   Suicidal Ideation:  Denies any suicidal ideations at this time Homicidal Ideation:  Denies any homicidal ideations AEB (as evidenced by):  Psychiatric Specialty Exam: Physical Exam  Review of Systems  Constitutional: Negative for fever and chills.  Eyes: Negative.        Today does not report blurry vision or visual difficulties   Respiratory: Negative for cough and shortness of breath.   Cardiovascular: Negative for chest pain.  Gastrointestinal: Negative for nausea, vomiting and abdominal pain.  Genitourinary: Negative.   Skin: Negative for rash.  Neurological:  Negative for seizures and loss of consciousness.  Psychiatric/Behavioral: Positive for hallucinations.    Blood pressure 134/79, pulse 82, temperature 98.2 F (36.8 C), temperature source Oral, resp. rate 16, height 5' 1.5" (1.562 m), weight 71.215 kg (157 lb), last menstrual period 12/05/2009, SpO2 100 %.Body mass index is 29.19 kg/(m^2).  General Appearance: improved grooming  Eye Contact::  Good- she is polite and pleasant upon approach  Speech:  improved  Volume:  Decreased  Mood:  describes mood as "OK"- denies sadness or depression  Affect:  more reactive, smiles often and appropriately  Thought Process:  Goal Directed and Linear  Orientation:  Other:  fully alert and attentive  Thought Content:  hallucinations have decreased, and somatic ruminations have also improved significantly  Suicidal Thoughts:  No- at this time denies any suicidal ideations  Homicidal Thoughts:  No  Memory:  recent and remote grossly intact   Judgement:  Impaired  Insight:  Lacking  Psychomotor Activity:  normal  Concentration:  Good  Recall:  Good  Fund of Knowledge:Good  Language: Good  Akathisia:  No  Handed:  Right  AIMS (if indicated):     Assets:  Desire for Improvement Resilience  Sleep:  Number of Hours: 6   Musculoskeletal: Strength & Muscle Tone: within normal limits Gait & Station: normal Patient leans: N/A  Current Medications: Current Facility-Administered Medications  Medication Dose Route Frequency Provider Last Rate Last Dose  . acetaminophen (TYLENOL) tablet 650 mg  650 mg Oral Q6H PRN Spencer E Simon, PA-C   650 mg at 06/28/14 0743  .   alum & mag hydroxide-simeth (MAALOX/MYLANTA) 200-200-20 MG/5ML suspension 30 mL  30 mL Oral Q4H PRN Laverle Hobby, PA-C      . citalopram (CELEXA) tablet 20 mg  20 mg Oral Daily Jenne Campus, MD   20 mg at 07/01/14 0916  . hydrALAZINE (APRESOLINE) tablet 10 mg  10 mg Oral 3 times per day Laverle Hobby, PA-C   10 mg at 07/01/14 0617  .  hydrochlorothiazide (HYDRODIURIL) tablet 25 mg  25 mg Oral Daily Saramma Eappen, MD   25 mg at 07/01/14 0916  . hydrOXYzine (ATARAX/VISTARIL) tablet 25 mg  25 mg Oral Q6H PRN Laverle Hobby, PA-C   25 mg at 06/27/14 2209  . ibuprofen (ADVIL,MOTRIN) tablet 600 mg  600 mg Oral Q6H PRN Laverle Hobby, PA-C   600 mg at 06/30/14 1539  . lisinopril (PRINIVIL,ZESTRIL) tablet 20 mg  20 mg Oral Daily Ursula Alert, MD   20 mg at 07/01/14 0916  . magnesium hydroxide (MILK OF MAGNESIA) suspension 30 mL  30 mL Oral Daily PRN Laverle Hobby, PA-C      . multivitamin with minerals tablet 1 tablet  1 tablet Oral Daily Laverle Hobby, PA-C   1 tablet at 07/01/14 0916  . pravastatin (PRAVACHOL) tablet 40 mg  40 mg Oral q1800 Laverle Hobby, PA-C   40 mg at 06/30/14 1702  . risperiDONE (RISPERDAL) tablet 2 mg  2 mg Oral BID Jenne Campus, MD   2 mg at 07/01/14 0915  . traZODone (DESYREL) tablet 50 mg  50 mg Oral QHS,MR X 1 Laverle Hobby, PA-C   50 mg at 06/30/14 2113    Lab Results:  No results found for this or any previous visit (from the past 48 hour(s)).  Physical Findings: AIMS: AIMS test performed- no EPS, no cogwheeling, no score on AIMS test ( no abnormal movements noted)  CIWA:    COWS:      Assessment: Patient continues to gradually improve, but does continue to express delusional material. She is no longer having hallucinations ( none so far today) and is more active in milieu, less withdrawn, and presenting with an improved range of affect. Tolerating Risperidone well thus far. Mild rash, maculopapular ( just a few clustered lesions on both forearms bilaterally and on lateral side of neck) without any mucosal or respiratory involvement. Does not appear to be medication rash, but will monitor.   Treatment Plan Summary: Daily contact with patient to assess and evaluate symptoms and progress in treatment Medication management See below  Plan: Continue inpatient treatment, milieu,  support Increase  Risperidone to   2 mgrs QAM and 3 mgrs QHS  Continue Celexa 20 mgrs QDAY   Medical Decision Making Problem Points:  Established problem, stable/improving (1), Review of last therapy session (1) and Review of psycho-social stressors (1) Data Points:  Review of medication regiment & side effects (2) Review of new medications or change in dosage (2)  I certify that inpatient services furnished can reasonably be expected to improve the patient's condition.   COBOS, [place] 07/01/2014, 4:45 PM

## 2014-07-01 NOTE — Progress Notes (Signed)
[facility] Group Notes:  (Nursing/MHT/Case Management/Adjunct)  Date:  07/01/2014  Time:  9:42 PM  Type of Therapy:  Psychoeducational Skills  Participation Level:  Active  Participation Quality:  Appropriate  Affect:  Defensive  Cognitive:  Appropriate  Insight:  Appropriate  Engagement in Group:  Engaged  Modes of Intervention:  Education  Summary of Progress/Problems: The patient shared with the group that she had a "calm" day overall. She states that she feels "fine" and that she is not experiencing any problems. The patient went into explanation about why she was admitted to the hospital and that she was here for a medication change only. The patient was unable to state a relapse prevention technique (theme for the day).   Hazle CocaGOODMAN, Orlandis Sanden S 07/01/2014, 9:42 PM

## 2014-07-01 NOTE — [facility] Group Notes (Signed)
[facility]   07/01/2014 9:43 AM  Participation Quality:  Appropriate   Mood/Affect:  Depressed and Flat  Depression Rating:  0  Anxiety Rating:  0  Thoughts of Suicide:  No Will you contract for safety?   NA  Current AVH:  No  Plan for Discharge/Comments:  Pt reports that she got the flu yesterday and felt sick last night. Pt reports moderate sleep and good appetite. She reports that she has not yet talked to her son. CSW encouraged her to reach out to him in order to continue working on place for her to stay at d/c. [facility]. She presents with depressed mood and calm affect.        Transportation Means:  Unknown at this time   Supports: son   Counselling psychologistmart, Lebron QuamHeather LCSWA

## 2014-07-01 NOTE — Clinical Social Work Note (Signed)
CSW left second message for pt's son Miranda Schuller(Abayomi) [phone] in order to check in with him regarding his efforts to work on living arrangements for pt. CSW requested call back at his earliest convenience.  The Sherwin-WilliamsHeather Smart, LCSWA 07/01/2014 2:05 PM

## 2014-07-01 NOTE — Tx Team (Signed)
Interdisciplinary Treatment Plan Update (Adult)   Date: 07/01/2014 Time Reviewed:10:02AM Progress in Treatment:  Attending groups: Yes  Participating in groups:  Yes  Taking medication as prescribed: Yes  Tolerating medication: Yes  Family/Significant othe contact made: SPE completed with pt's son/collateral info also obtained.    Patient understands diagnosis: Yes, AEB seeking treatment for AH, mood stabilization, and for med management.  Discussing patient identified problems/goals with staff: Yes  Medical problems stabilized or resolved: Yes  Denies suicidal/homicidal ideation: Yes during admission/self report.  Patient has not harmed self or Others: Yes  New problem(s) identified:  Discharge Plan or Barriers: [facility]. Her son is working with her to establish  Additional comments: Swango is a 58 year old African American female that reports hearing voices of people cursing at her. Patient reports that the voices are curing her family. Patient reports that the voices are not telling her to harm herself or others. Patient reports that she has been hearing these voices for the past month. Patient reports that she hears the voices every day and states they sometimes call her name. Patient reports that they are not voices of people she knows and states they intermittently wake her up at night. Documentation in epic reports that that the patient stated that the voices tell her they are going to murder someone. Patient reports that she has experiences stress related to substandard living conditions at her apartment. Patient reports that she has to move out last Wednesday to the [employer] [place]. Patient denies a prior history of psychiatric hospitalization. Patient denies prior mental health treatment. Patient denies substance abuse. Patient BAL is <11 and there is not a UDS completed on the patient. Patient denies SI and HI. Patient brought herself to the ED initially  complaining of a sensation of something stuck in her throat. Patient reports that she has had this feeling intermittently since 02/24/14. Patient reports that she has difficulty speaking because it feels like something is blocking her throat.  11/6: pt continues to present with depressed mood and flat affect. Pt somatically focused, complaining of eye issues as her main problem for being at the hospital. Pt reports that she has not yet spoken to her son regarding where she will live at d/c. CSW encouraged pt to reach out to her son today. [facility]. She reports no problems with medications.   Reason for Continuation of Hospitalization: Mood stabilization Medication management  Estimated length of stay: 3-4 days   For review of initial/current patient goals, please see plan of care.  Attendees:  Patient:    Family:    Physician: Dr. Adela Glimpseabos MD  07/01/2014 11:02 AM   Nursing: Lucretia FieldLinsey, Patty RN 07/01/2014 11:02 AM   Clinical Social Worker Arthurine Oleary Smart, LCSWA  07/01/2014 11:02 AM   Other: Daryel Geraldodney North, LCSW 07/01/2014 11:02 AM   Other: Darden DatesJennifer C. Nurse CM 07/01/2014 11:02 AM   Other: Charleston Ropesandace Hyatt, CSW intern   07/01/2014 11:02 AM   Other: Tomasita Morrowelora Sutton, Care Coordintor 07/01/2014 11:02AM  Scribe for Treatment Team:  Herbert SetaHeather Smart LCSWA  07/01/2014 11:02 AM

## 2014-07-01 NOTE — [facility] Group Notes (Signed)
[facility] LCSW Group Therapy  07/01/2014  1:05 PM  Type of Therapy:  Group therapy  Participation Level:  Active  Participation Quality:  Attentive  Affect:  Flat  Cognitive:  Oriented  Insight:  Limited  Engagement in Therapy:  Limited  Modes of Intervention:  Discussion, Socialization  Summary of Progress/Problems:  Chaplain was here to lead a group on themes of hope and courage.  Fulton Molelice was gone for much of group as she was meeting in with the Dr.  She identified family as her main community, and talked about her appreciation for the support of her children. She also talked about her neighborhood, and interestingly named the businesses that make up a community, including the police station and court house. Daryel Geraldorth, Verlena Marlette B 07/01/2014 1:47 PM

## 2014-07-02 NOTE — Progress Notes (Signed)
D) Pt has been attending the groups but continues to hear voices. States, I have never heard voices before". Verbalizes, "it's scary" . States recent stressors is her children moving away and now she is needing to take care of herself. Is aware, "my children are suppose to go out on their own. Right now, I need to be able to provide for me". Affect is tense and fearful looking at times. Mood is frightened. A) Giving Pt a lot of reassurance and praise. Providing Pt with reality and giving an explanation of the voices she is hearing. Provided with a gentle approach and given a 1:1 R) Pt denies SI and HI. Rates her depression at a 4, hopelessness at a 5 and her anxiety at a 4.

## 2014-07-02 NOTE — Progress Notes (Signed)
Patient ID: Miranda Caballice Braddy, female   DOB: 07-17-1956, 58 y.o.   MRN: [id number]  [facility]  07/02/2014 5:30 PM Miranda Griffin  MRN:  [id number] Subjective: Patient describes rash and states that it is improving (presents as benign and healing). Pt reports "I'm not seeing anything, but I'm just hearing scratching and some voices sometimes. I'm having awful sleep and appetite but overall, I'm better." Objective: Pt seen and chart reviewed. Pt denies SI, HI, and AVH, contracts for safety. Pt reports that her sleep and appetite are both doing poorly but that she is benefiting from the medications and the voices/sounds in her head are improving. Pt does not want an increase in sleep medication, stating that she feels it is from her roommate snoring.   Diagnosis:  Psychosis NOS  Total Time spent with patient: 25 minutes    ADL's: improved  Sleep: Good   Appetite:  Good   Suicidal Ideation:  Denies any suicidal ideations at this time Homicidal Ideation:  Denies any homicidal ideations AEB (as evidenced by):  Psychiatric Specialty Exam: Physical Exam  Review of Systems  Constitutional: Negative for fever and chills.  Eyes: Negative.        Today does not report blurry vision or visual difficulties   Respiratory: Negative for cough and shortness of breath.   Cardiovascular: Negative for chest pain.  Gastrointestinal: Negative for nausea, vomiting and abdominal pain.  Genitourinary: Negative.   Skin: Negative for rash.  Neurological: Negative for seizures and loss of consciousness.  Psychiatric/Behavioral: Positive for hallucinations. Negative for suicidal ideas.    Blood pressure 125/77, pulse 93, temperature 98.6 F (37 C), temperature source Oral, resp. rate 18, height 5' 1.5" (1.562 m), weight 71.215 kg (157 lb), last menstrual period 12/05/2009, SpO2 100 %.Body mass index is 29.19 kg/(m^2).  General Appearance: improved grooming  Eye Contact::  Good- she is polite  and pleasant upon approach  Speech:  improved  Volume:  Decreased  Mood:  describes mood as "OK"- denies sadness or depression  Affect:  more reactive, smiles often and appropriately  Thought Process:  Goal Directed and Linear  Orientation:  Other:  fully alert and attentive  Thought Content:  hallucinations have decreased, and somatic ruminations have also improved significantly  Suicidal Thoughts:  No- at this time denies any suicidal ideations  Homicidal Thoughts:  No  Memory:  recent and remote grossly intact   Judgement:  Impaired  Insight:  Lacking  Psychomotor Activity:  normal  Concentration:  Good  Recall:  Good  Fund of Knowledge:Good  Language: Good  Akathisia:  No  Handed:  Right  AIMS (if indicated):     Assets:  Desire for Improvement Resilience  Sleep:  Number of Hours: 5.75   Musculoskeletal: Strength & Muscle Tone: within normal limits Gait & Station: normal Patient leans: N/A  Current Medications: Current Facility-Administered Medications  Medication Dose Route Frequency Provider Last Rate Last Dose  . acetaminophen (TYLENOL) tablet 650 mg  650 mg Oral Q6H PRN Kerry HoughSpencer E Simon, PA-C   650 mg at 06/28/14 0743  . alum & mag hydroxide-simeth (MAALOX/MYLANTA) 200-200-20 MG/5ML suspension 30 mL  30 mL Oral Q4H PRN Kerry HoughSpencer E Simon, PA-C      . citalopram (CELEXA) tablet 20 mg  20 mg Oral Daily Craige CottaFernando A Cobos, MD   20 mg at 07/02/14 0759  . hydrALAZINE (APRESOLINE) tablet 10 mg  10 mg Oral 3 times per day Kerry HoughSpencer E Simon, PA-C   10 mg  at 07/02/14 1552  . hydrochlorothiazide (HYDRODIURIL) tablet 25 mg  25 mg Oral Daily Saramma Eappen, MD   25 mg at 07/02/14 0759  . hydrOXYzine (ATARAX/VISTARIL) tablet 25 mg  25 mg Oral Q6H PRN Kerry HoughSpencer E Simon, PA-C   25 mg at 06/27/14 2209  . ibuprofen (ADVIL,MOTRIN) tablet 600 mg  600 mg Oral Q6H PRN Kerry HoughSpencer E Simon, PA-C   600 mg at 06/30/14 1539  . lisinopril (PRINIVIL,ZESTRIL) tablet 20 mg  20 mg Oral Daily Jomarie LongsSaramma Eappen, MD    20 mg at 07/02/14 0759  . magnesium hydroxide (MILK OF MAGNESIA) suspension 30 mL  30 mL Oral Daily PRN Kerry HoughSpencer E Simon, PA-C      . multivitamin with minerals tablet 1 tablet  1 tablet Oral Daily Kerry HoughSpencer E Simon, PA-C   1 tablet at 07/02/14 0759  . pravastatin (PRAVACHOL) tablet 40 mg  40 mg Oral q1800 Kerry HoughSpencer E Simon, PA-C   40 mg at 07/01/14 1700  . risperiDONE (RISPERDAL) tablet 2 mg  2 mg Oral q morning - 10a Craige CottaFernando A Cobos, MD   2 mg at 07/02/14 1204  . risperiDONE (RISPERDAL) tablet 3 mg  3 mg Oral QHS Craige CottaFernando A Cobos, MD   3 mg at 07/01/14 2120  . traZODone (DESYREL) tablet 50 mg  50 mg Oral QHS,MR X 1 Kerry HoughSpencer E Simon, PA-C   50 mg at 07/01/14 2121    Lab Results:  No results found for this or any previous visit (from the past 48 hour(s)).  Physical Findings: AIMS: AIMS test performed- no EPS, no cogwheeling, no score on AIMS test ( no abnormal movements noted)  CIWA:    COWS:      Assessment: Patient continues to gradually improve, but does continue to express delusional material. She is no longer having hallucinations ( none so far today) and is more active in milieu, less withdrawn, and presenting with an improved range of affect. Tolerating Risperidone well thus far. Mild rash, maculopapular ( just a few clustered lesions on both forearms bilaterally and on lateral side of neck) without any mucosal or respiratory involvement. Does not appear to be medication rash, but will monitor.   Treatment Plan Summary: Daily contact with patient to assess and evaluate symptoms and progress in treatment Medication management See below  Plan: Continue inpatient treatment, milieu, support Continue Risperidone to   2 mgrs QAM and 3 mgrs QHS  Continue Celexa 20 mgrs QDAY   Medical Decision Making Problem Points:  Established problem, stable/improving (1), Review of last therapy session (1) and Review of psycho-social stressors (1) Data Points:  Review of medication regiment & side  effects (2) Review of new medications or change in dosage (2)  I certify that inpatient services furnished can reasonably be expected to improve the patient's condition.   Beau FannyWithrow, John C, FNP-BC 07/02/2014, 2:29 PM I agree with assessment and plan Madie Renorving A. Dub MikesLugo, M.D.

## 2014-07-02 NOTE — Progress Notes (Signed)
Patient ID: Miranda Griffin, female   DOB: [birth year], 58 y.o.   MRN: [id number] Pt spending most of her time in her room if not attending groups. Pt with flat affect. Pt reported she is hearing voices but unable to understand what they are saying. Pt stated the voices are "far off". Pt denied SI, HI and VH. At one point during the evening, patient came out of her room asking who had called her. When patient was assured that no one had called her name she went back into her room. Needs assessed. Pt denied. Support and encouragement provided. Pt receptive. Fifteen minute checks continue for patient safety. Pt safe on unit.

## 2014-07-02 NOTE — Progress Notes (Signed)
Psychoeducational Group Note  Date:  07/02/2014 Time: 1015  Group Topic/Focus:  Identifying Needs:   The focus of this group is to help patients identify their personal needs that have been historically problematic and identify healthy behaviors to address their needs.  Participation Level:  Minimal  Participation Quality:  Attentive  Affect:  Flat  Cognitive:  Lacking  Insight:  Limited  Engagement in Group:  Lacking  Additional Comments:    07/02/2014,10:51 AM Ginnie Marich, Joie BimlerPatricia Lynn

## 2014-07-02 NOTE — [facility] Group Notes (Signed)
[facility] LCSW Group Therapy  07/02/2014 12:25 PM  Type of Therapy:  Group Therapy  Participation Level:  Minimal  Participation Quality:  Appropriate  Affect:  Flat  Cognitive:  Alert  Insight:  Limited  Engagement in Therapy:  Limited  Modes of Intervention:  Education  Summary of Progress/Problems: Today's group was about coping with life situations using a positive attitude. The group described attitude and identify both good and bad attitudes, the consequences and benefits of both.  Group further discussed which attitude to have and how to develop a good/positive attitude.  Closed by group sharing how each person can do something to have a better attitude.   Beverly Sessionsywan J Lindsey MSW, LCSW  Clide DalesHarrill, Catherine Campbell 07/02/2014, 12:25 PM

## 2014-07-02 NOTE — Progress Notes (Signed)
The focus of this group is to help patients review their daily goal of treatment and discuss progress on daily workbooks. Pt did not attend the evening group. 

## 2014-07-03 DIAGNOSIS — F29 Unspecified psychosis not due to a substance or known physiological condition: Secondary | ICD-10-CM | POA: Insufficient documentation

## 2014-07-03 MED ORDER — TRAZODONE HCL 100 MG PO TABS
100.0000 mg | ORAL_TABLET | Freq: Every evening | ORAL | Status: DC | PRN
  Administered 2014-07-03 – 2014-07-06 (×4): 100 mg via ORAL
  Filled 2014-07-03: qty 1
  Filled 2014-07-03: qty 28
  Filled 2014-07-03: qty 1
  Filled 2014-07-03: qty 28
  Filled 2014-07-03 (×8): qty 1

## 2014-07-03 MED ORDER — BENZTROPINE MESYLATE 1 MG PO TABS
1.0000 mg | ORAL_TABLET | Freq: Two times a day (BID) | ORAL | Status: DC
  Administered 2014-07-03 – 2014-07-07 (×8): 1 mg via ORAL
  Filled 2014-07-03 (×8): qty 1
  Filled 2014-07-03: qty 28
  Filled 2014-07-03: qty 1
  Filled 2014-07-03: qty 28
  Filled 2014-07-03 (×2): qty 1

## 2014-07-03 MED ORDER — BENZOCAINE 10 % MT GEL
Freq: Three times a day (TID) | OROMUCOSAL | Status: DC | PRN
  Administered 2014-07-03: 17:00:00 via OROMUCOSAL
  Administered 2014-07-04: 1 via OROMUCOSAL
  Administered 2014-07-06: 08:00:00 via OROMUCOSAL
  Filled 2014-07-03: qty 9.4

## 2014-07-03 NOTE — [facility] Group Notes (Signed)
[facility] Group Notes:  (Nursing/MHT/Case Management/Adjunct)  Date:  07/03/2014  Time:  3:07 PM  Type of Therapy:  Psychoeducational Skills-Pt self inventory group   Participation Level:  Active  Participation Quality:  Appropriate  Affect:  Appropriate  Cognitive:  Appropriate  Insight:  Appropriate  Engagement in Group:  Engaged  Modes of Intervention:  Discussion  Summary of Progress/Problems: Pt did attend self inventory group.  Jacquelyne BalintForrest, Hadleigh Felber Shanta 07/03/2014, 3:07 PM

## 2014-07-03 NOTE — Progress Notes (Signed)
Psychoeducational Group Note  Date:  07/03/2014 Time:  2155  Group Topic/Focus:  Wrap-Up Group:   The focus of this group is to help patients review their daily goal of treatment and discuss progress on daily workbooks.  Participation Level: Did Not Attend  Participation Quality:  Not Applicable  Affect:  Not Applicable  Cognitive:  Not Applicable  Insight:  Not Applicable  Engagement in Group: Not Applicable  Additional Comments:  The patient did not attend group this evening.   Hazle CocaGOODMAN, Arriah Wadle S 07/03/2014, 9:55 PM

## 2014-07-03 NOTE — [facility] Group Notes (Signed)
[facility] Group Notes:  (Nursing/MHT/Case Management/Adjunct)  Date:  07/03/2014  Time:  3:30 PM  Type of Therapy:  Psychoeducational Skills-Healthy Support Systems  Participation Level:  Active  Participation Quality:  Appropriate  Affect:  Appropriate  Cognitive:  Appropriate  Insight:  Appropriate  Engagement in Group:  Engaged  Modes of Intervention:  Discussion  Summary of Progress/Problems: Pt did attend healthy support systems group.   Jacquelyne BalintForrest, Olinda Nola Shanta 07/03/2014, 3:30 PM

## 2014-07-03 NOTE — Progress Notes (Signed)
D: Pt mood is depressed. Pt has been lying in bed for most of this shift. A: Support given. Verbalization encouraged. Medications given as prescribed. Pt encouraged to come to staff with any concerns.  R: Pt is receptive. No complaints. Pt remains safe on the unit. Q6415min safety checks maintained. Will continue to monitor pt.

## 2014-07-03 NOTE — [facility] Group Notes (Signed)
[facility] LCSW Group Therapy  07/03/2014 3:04 PM  Type of Therapy:  Group Therapy  Participation Level:  Did Not Attend  Participation Quality:  N/A  Affect:  N/A  Cognitive:  N/A  Insight:  N/A  Engagement in Therapy:  N/A  Modes of Intervention:  Discussion, Education, Exploration, Rapport Building and Support  Summary of Progress/Problems:Pt did not attend.  Seabron SpatesVaughn, Naresh Althaus Anne 07/03/2014, 3:04 PM

## 2014-07-03 NOTE — Progress Notes (Signed)
Patient ID: Miranda Griffin, female   DOB: 07-Jul-1956, 58 y.o.   MRN: [id number] D: client in bed most of the evening, denies voices, forwards little. A: Writer introduced self encourage client to reports any concerns, also encouraged group. Writer reviewed medications, administered as ordered. Staff will monitor q2815min for safety. R: client is safe on the unit, did not attend group, up for medications.

## 2014-07-03 NOTE — Progress Notes (Signed)
D) Pt states that she is feeling a bit better today than yesterday. Not hearing voices today. Stated "the medicine that you gave me has helped me". Affect is flat and mood remains depressed. C/O tooth pain today and stating it is my molars that hurt and I am sensitive to the cold water. Affect is flat and mood remains depressed. Rates her depression at a 3, hopelessness at a 4 and her anxiety at a 5. Denies SI and HI A) Given support, reassurance and praise. Encouragement given. Provided with a 1:1. R) Denies SI and HI as well as auditory hallucinations.

## 2014-07-03 NOTE — Progress Notes (Signed)
Patient ID: Miranda Griffin, female   DOB: October 01, 1955, 58 y.o.   MRN: [id number]  [facility]  07/03/2014 10:09 AM Miranda Griffin  MRN:  [id number] Subjective: Pt reports that she slept better after sleep medication was obtained from the night shift on-call provider, but that the initial medication was ineffective. Pt reports that she is still hearing voices and seeing things, but that "I think it's what other people are seeing, right?". Pt reports mouth pain (Orajel ordered).   Objective: Pt seen and chart reviewed. Pt denies SI, HI, and AVH, contracts for safety. Pt reports that her sleep and appetite are both doing poorly but that she is benefiting from the medications and the voices/sounds in her head are improving.   Diagnosis:  Psychosis NOS  Total Time spent with patient: 25 minutes   ADL's: improved  Sleep: Good   Appetite:  Good   Suicidal Ideation:  Denies any suicidal ideations at this time Homicidal Ideation:  Denies any homicidal ideations AEB (as evidenced by):  Psychiatric Specialty Exam: Physical Exam  Review of Systems  Constitutional: Negative for fever and chills.  Eyes: Negative.        Today does not report blurry vision or visual difficulties   Respiratory: Negative for cough and shortness of breath.   Cardiovascular: Negative for chest pain.  Gastrointestinal: Negative for nausea, vomiting and abdominal pain.  Genitourinary: Negative.   Skin: Negative for rash.  Neurological: Negative for seizures and loss of consciousness.  Psychiatric/Behavioral: Positive for hallucinations. Negative for suicidal ideas.    Blood pressure 136/70, pulse 86, temperature 98.1 F (36.7 C), temperature source Oral, resp. rate 16, height 5' 1.5" (1.562 m), weight 71.215 kg (157 lb), last menstrual period 12/05/2009, SpO2 100 %.Body mass index is 29.19 kg/(m^2).  General Appearance: improved grooming  Eye Contact::  Good- she is polite and pleasant upon approach   Speech:  improved  Volume:  Decreased  Mood:  describes mood as "OK"- denies sadness or depression  Affect:  more reactive, smiles often and appropriately  Thought Process:  Goal Directed and Linear  Orientation:  Other:  fully alert and attentive  Thought Content:  hallucinations have decreased, and somatic ruminations have also improved significantly  Suicidal Thoughts:  No- at this time denies any suicidal ideations  Homicidal Thoughts:  No  Memory:  recent and remote grossly intact   Judgement:  Impaired  Insight:  Lacking  Psychomotor Activity:  normal  Concentration:  Good  Recall:  Good  Fund of Knowledge:Good  Language: Good  Akathisia:  No  Handed:  Right  AIMS (if indicated):     Assets:  Desire for Improvement Resilience  Sleep:  Number of Hours: 6.75   Musculoskeletal: Strength & Muscle Tone: within normal limits Gait & Station: normal Patient leans: N/A  Current Medications: Current Facility-Administered Medications  Medication Dose Route Frequency Provider Last Rate Last Dose  . acetaminophen (TYLENOL) tablet 650 mg  650 mg Oral Q6H PRN Kerry HoughSpencer E Simon, PA-C   650 mg at 06/28/14 0743  . alum & mag hydroxide-simeth (MAALOX/MYLANTA) 200-200-20 MG/5ML suspension 30 mL  30 mL Oral Q4H PRN Kerry HoughSpencer E Simon, PA-C      . benzocaine (ORAJEL) 10 % mucosal gel   Mouth/Throat TID PRN Beau FannyJohn C Withrow, FNP      . benztropine (COGENTIN) tablet 1 mg  1 mg Oral BID Beau FannyJohn C Withrow, FNP      . citalopram (CELEXA) tablet 20 mg  20 mg  Oral Daily Craige CottaFernando A Cobos, MD   20 mg at 07/03/14 0847  . hydrALAZINE (APRESOLINE) tablet 10 mg  10 mg Oral 3 times per day Kerry HoughSpencer E Simon, PA-C   10 mg at 07/03/14 0715  . hydrochlorothiazide (HYDRODIURIL) tablet 25 mg  25 mg Oral Daily Saramma Eappen, MD   25 mg at 07/03/14 0836  . hydrOXYzine (ATARAX/VISTARIL) tablet 25 mg  25 mg Oral Q6H PRN Kerry HoughSpencer E Simon, PA-C   25 mg at 06/27/14 2209  . ibuprofen (ADVIL,MOTRIN) tablet 600 mg  600 mg Oral Q6H  PRN Kerry HoughSpencer E Simon, PA-C   600 mg at 06/30/14 1539  . lisinopril (PRINIVIL,ZESTRIL) tablet 20 mg  20 mg Oral Daily Jomarie LongsSaramma Eappen, MD   20 mg at 07/03/14 0837  . magnesium hydroxide (MILK OF MAGNESIA) suspension 30 mL  30 mL Oral Daily PRN Kerry HoughSpencer E Simon, PA-C      . multivitamin with minerals tablet 1 tablet  1 tablet Oral Daily Kerry HoughSpencer E Simon, PA-C   1 tablet at 07/03/14 0836  . pravastatin (PRAVACHOL) tablet 40 mg  40 mg Oral q1800 Kerry HoughSpencer E Simon, PA-C   40 mg at 07/02/14 1733  . risperiDONE (RISPERDAL) tablet 2 mg  2 mg Oral q morning - 10a Craige CottaFernando A Cobos, MD   2 mg at 07/03/14 0837  . risperiDONE (RISPERDAL) tablet 3 mg  3 mg Oral QHS Craige CottaFernando A Cobos, MD   3 mg at 07/02/14 2155  . traZODone (DESYREL) tablet 100 mg  100 mg Oral QHS,MR X 1 Beau FannyJohn C Withrow, FNP        Lab Results:  No results found for this or any previous visit (from the past 48 hour(s)).  Physical Findings: AIMS: AIMS test performed- no EPS, no cogwheeling, no score on AIMS test ( no abnormal movements noted)  CIWA:    COWS:      Assessment: Patient continues to gradually improve, but does continue to express delusional material. She is no longer having hallucinations ( none so far today) and is more active in milieu, less withdrawn, and presenting with an improved range of affect. Tolerating Risperidone well thus far. Mild rash, maculopapular ( just a few clustered lesions on both forearms bilaterally and on lateral side of neck) without any mucosal or respiratory involvement. Does not appear to be medication rash, but will monitor.   Treatment Plan Summary: Daily contact with patient to assess and evaluate symptoms and progress in treatment Medication management See below  Plan: Continue inpatient treatment, milieu, support  Continue Risperidone to   2 mgrs QAM and 3 mgrs QHS  Continue Celexa 20 mgrs QDAY  Increase Trazodone to 100mg  qhs Cogentin 1mg  bid  Medical Decision Making Problem Points:   Established problem, stable/improving (1), Review of last therapy session (1) and Review of psycho-social stressors (1) Data Points:  Review of medication regiment & side effects (2) Review of new medications or change in dosage (2)  I certify that inpatient services furnished can reasonably be expected to improve the patient's condition.   Beau FannyWithrow, John C, FNP-BC 07/03/2014, 10:09 AM I agree with assessment and plan Madie Renorving A. Dub MikesLugo, M.D.

## 2014-07-04 DIAGNOSIS — F29 Unspecified psychosis not due to a substance or known physiological condition: Secondary | ICD-10-CM

## 2014-07-04 MED ORDER — RISPERIDONE 3 MG PO TABS
3.0000 mg | ORAL_TABLET | Freq: Two times a day (BID) | ORAL | Status: DC
  Administered 2014-07-04 – 2014-07-06 (×4): 3 mg via ORAL
  Filled 2014-07-04 (×2): qty 1
  Filled 2014-07-04: qty 3
  Filled 2014-07-04 (×6): qty 1

## 2014-07-04 NOTE — Progress Notes (Signed)
The focus of this group is to help patients review their daily goal of treatment and discuss progress on daily workbooks. Pt attended the last ten minutes of wrap-up group and responded to all discussion prompts from the Writer. Pt shared that today was a good day and that she enjoyed the two groups she attended. Pt told the group that upon discharge she hoped to take some classes in order to gain skills that would make her employable. "I want to get a job and stay active. My kids are all gone from home and I am alone. I need to do something with my time." Pt's affect was appropriate.

## 2014-07-04 NOTE — [facility] Group Notes (Signed)
[facility]   07/04/2014 10:08 AM  Participation Quality:  Minimal   Mood/Affect:  Flat  Depression Rating:  3-4  Anxiety Rating:  3-4  Thoughts of Suicide:  No Will you contract for safety?   NA  Current AVH:  Yes-decreasing voices   Plan for Discharge/Comments:  [facility] and stated that she has not been able to reach her son (CSW left 2 messages for son on Friday with no call back). Pt unsure if son is going to help her with housing. Pt likely to d/c tomorrow (tuesday). CSW assessing.   Transportation Means: bus/son   Supports: adult Building surveyorchildren   Smart, OncologistHeather LCSWA

## 2014-07-04 NOTE — [facility] Group Notes (Signed)
[facility] LCSW Group Therapy  07/04/2014 1:36 PM  Type of Therapy:  Group Therapy  Participation Level:  Minimal  Participation Quality:  Attentive  Affect:  Depressed, Flat and Irritable  Cognitive:  Alert  Insight:  Limited  Engagement in Therapy:  Limited  Modes of Intervention:  Confrontation, Discussion, Education, Exploration, Problem-solving, Rapport Building, Socialization and Support  Summary of Progress/Problems: Today's Topic: Overcoming Obstacles. Group members identified obstacles faced currently and processed barriers involved in overcoming these obstacles. Group members identified steps necessary for overcoming these obstacles and explored motivation (internal and external) for facing these difficulties head on. Group members further identified one area of concern in their lives and chose a skill of focus pulled from their "toolbox." Miranda Griffin shared that her biggest obstacle involves finding ways to occupy her time now that her children have moved away. Pt states that her changing role is challenging but identified ways to overcome this obstacle. "I plan to find work again and gain employment. I need to occupy my time." "I never understood what people meant when they said empty nest syndrome, but now I know." Miranda Griffin continues to make some progress in the group setting and demonstrates improving insight AEB her ability to problem solve regarding her current obstacle.   Smart, [name] LCSWA 07/04/2014, 1:36 PM

## 2014-07-04 NOTE — Progress Notes (Signed)
Patient ID: Miranda Griffin, female   DOB: May 11, 1956, 58 y.o.   MRN: [id number] D:Affect is flat/sad at times ,mood is depressed. Pt rates her day as mildly depressed with some hopelessness. Pt is focused on when she is going to be discharged. Pt denies A/V hallucinations when asked however she does appear to be responding to internal stimuli at times. A:Support and encouragement offered. R:Receptive. No complaints of pain or problems at this time.

## 2014-07-04 NOTE — Progress Notes (Signed)
Patient ID: Miranda Griffin, female   DOB: 02/17/1956, 58 y.o.   MRN: [id number] D: Client is pleasant soft spoken, up for group today, reports feeling better says medicine made her sleepy yesterday. Client says she hopes to be discharged today. A: Writer provided emotional support, reviewed and administered medications. Staff will monitor q6315min for safety. R: client is safe on the unit.

## 2014-07-04 NOTE — Progress Notes (Signed)
Patient ID: Miranda Griffin, female   DOB: [birth year], 57 y.o.   MRN: [id number]  [facility]  07/04/2014 2:18 PM Miranda Caballice Strausbaugh  MRN:  [id number] Subjective: Patient states she feels better, and today denies any hallucinations. She states her mood is "OK"  Objective: Although improved, patient does continue to have psychotic symptoms. In particular , she still firmly believes there were some government agents in her apartment making her go through a " ceremony" and has a bizarre thought that they were reading her mind via her L eye lacrimal canal. Still, she only mentions these ideations when specifically asked, and is well able to discuss other issues, to include disposition planning, calmly, appropriately, and without psychotic elements interfering. She also denies any hallucinations and does not appear internally preoccupied. She is also more amenable to consider her symptoms as above as a symptom of mental illness and question the reality of it. She denies depression. Behavior on unit in good control. Going to groups, no disruptive behaviors. Tolerating medications well and denies side effects. No EPS or cogwheeling.  Diagnosis:  Psychosis NOS  Total Time spent with patient: 25 minutes   ADL's: improved  Sleep: Good   Appetite:  Good   Suicidal Ideation:  Denies any suicidal ideations at this time Homicidal Ideation:  Denies any homicidal ideations AEB (as evidenced by):  Psychiatric Specialty Exam: Physical Exam  Review of Systems  Constitutional: Negative for fever and chills.  Eyes: Negative.        Today does not report blurry vision or visual difficulties   Respiratory: Negative for cough and shortness of breath.   Cardiovascular: Negative for chest pain.  Gastrointestinal: Negative for nausea, vomiting and abdominal pain.  Genitourinary: Negative.   Skin: Negative for rash.  Neurological: Negative for seizures and loss of consciousness.   Psychiatric/Behavioral: Positive for hallucinations. Negative for suicidal ideas.    Blood pressure 141/76, pulse 75, temperature 98.3 F (36.8 C), temperature source Oral, resp. rate 16, height 5' 1.5" (1.562 m), weight 71.215 kg (157 lb), last menstrual period 12/05/2009, SpO2 100 %.Body mass index is 29.19 kg/(m^2).  General Appearance: improved grooming  Eye Contact::  Good- she is polite and pleasant upon approach  Speech:  Normal Rate and improved  Volume:  Decreased  Mood:  describes mood as "OK"- denies sadness or depression  Affect:  more reactive, smiles often and appropriately  Thought Process:  Goal Directed and Linear  Orientation:  Other:  fully alert and attentive  Thought Content:  today denies auditory or visual halls. (+) delusions still noted, but less preoccupied about these   Suicidal Thoughts:  No- at this time denies any suicidal ideations  Homicidal Thoughts:  No  Memory:  recent and remote grossly intact   Judgement:  Impaired  Insight:  Lacking  Psychomotor Activity:  normal  Concentration:  Good  Recall:  Good  Fund of Knowledge:Good  Language: Good  Akathisia:  No  Handed:  Right  AIMS (if indicated):     Assets:  Desire for Improvement Resilience  Sleep:  Number of Hours: 6.75   Musculoskeletal: Strength & Muscle Tone: within normal limits Gait & Station: normal Patient leans: N/A  Current Medications: Current Facility-Administered Medications  Medication Dose Route Frequency Provider Last Rate Last Dose  . acetaminophen (TYLENOL) tablet 650 mg  650 mg Oral Q6H PRN Kerry HoughSpencer E Simon, PA-C   650 mg at 06/28/14 0743  . alum & mag hydroxide-simeth (MAALOX/MYLANTA) 200-200-20 MG/5ML suspension 30  mL  30 mL Oral Q4H PRN Kerry HoughSpencer E Simon, PA-C      . benzocaine (ORAJEL) 10 % mucosal gel   Mouth/Throat TID PRN Beau FannyJohn C Withrow, FNP   1 application at 07/04/14 0908  . benztropine (COGENTIN) tablet 1 mg  1 mg Oral BID Beau FannyJohn C Withrow, FNP   1 mg at 07/04/14  0845  . citalopram (CELEXA) tablet 20 mg  20 mg Oral Daily Craige CottaFernando A Cobos, MD   20 mg at 07/04/14 0845  . hydrALAZINE (APRESOLINE) tablet 10 mg  10 mg Oral 3 times per day Kerry HoughSpencer E Simon, PA-C   10 mg at 07/04/14 0636  . hydrochlorothiazide (HYDRODIURIL) tablet 25 mg  25 mg Oral Daily Saramma Eappen, MD   25 mg at 07/04/14 0845  . hydrOXYzine (ATARAX/VISTARIL) tablet 25 mg  25 mg Oral Q6H PRN Kerry HoughSpencer E Simon, PA-C   25 mg at 06/27/14 2209  . ibuprofen (ADVIL,MOTRIN) tablet 600 mg  600 mg Oral Q6H PRN Kerry HoughSpencer E Simon, PA-C   600 mg at 06/30/14 1539  . lisinopril (PRINIVIL,ZESTRIL) tablet 20 mg  20 mg Oral Daily Jomarie LongsSaramma Eappen, MD   20 mg at 07/04/14 0845  . magnesium hydroxide (MILK OF MAGNESIA) suspension 30 mL  30 mL Oral Daily PRN Kerry HoughSpencer E Simon, PA-C      . multivitamin with minerals tablet 1 tablet  1 tablet Oral Daily Kerry HoughSpencer E Simon, PA-C   1 tablet at 07/04/14 0845  . pravastatin (PRAVACHOL) tablet 40 mg  40 mg Oral q1800 Kerry HoughSpencer E Simon, PA-C   40 mg at 07/03/14 1701  . risperiDONE (RISPERDAL) tablet 2 mg  2 mg Oral q morning - 10a Craige CottaFernando A Cobos, MD   2 mg at 07/04/14 0955  . risperiDONE (RISPERDAL) tablet 3 mg  3 mg Oral QHS Craige CottaFernando A Cobos, MD   3 mg at 07/03/14 2205  . traZODone (DESYREL) tablet 100 mg  100 mg Oral QHS,MR X 1 Beau FannyJohn C Withrow, FNP   100 mg at 07/03/14 2205    Lab Results:  No results found for this or any previous visit (from the past 48 hour(s)).  Physical Findings: AIMS: AIMS test performed- no EPS, no cogwheeling, no score on AIMS test ( no abnormal movements noted)  CIWA:    COWS:      Assessment: Gradual, slow improvement. Still delusional, but behavior calm, in control, and less ruminative and less focused on delusional material. Hallucinations have resolved. Tolerating Risperidone well.    Treatment Plan Summary: Daily contact with patient to assess and evaluate symptoms and progress in treatment Medication management See below  Plan: Continue  inpatient treatment, milieu, support  Increase Risperidone to 3 mgrs BID Continue Celexa 20 mgrs QDAY  Increase Trazodone to 100mg  qhs Cogentin 1mg  bid  Medical Decision Making Problem Points:  Established problem, stable/improving (1), Review of last therapy session (1) and Review of psycho-social stressors (1) Data Points:  Review of medication regiment & side effects (2) Review of new medications or change in dosage (2)  I certify that inpatient services furnished can reasonably be expected to improve the patient's condition.   COBOS, FERNANDO, FNP-BC 07/04/2014, 10:09 AM

## 2014-07-05 MED ORDER — CITALOPRAM HYDROBROMIDE 20 MG PO TABS
30.0000 mg | ORAL_TABLET | Freq: Every day | ORAL | Status: DC
  Administered 2014-07-06 – 2014-07-07 (×2): 30 mg via ORAL
  Filled 2014-07-05 (×3): qty 1

## 2014-07-05 NOTE — [facility] Group Notes (Signed)
[facility] LCSW Group Therapy  07/05/2014 12:23 PM  Type of Therapy:  Group Therapy  Participation Level:  Did Not Attend  Participation Quality:  na  Affect:  na  Cognitive:  na  Insight:  None  Engagement in Therapy:  None  Modes of Intervention:  Discussion, Education and Exploration  Summary of Progress/Problems:  Emotion Regulation: This group focused on both positive and negative emotion identification and allowed group members to process ways to identify feelings, regulate negative emotions, and find healthy ways to manage internal/external emotions. Group members were asked to reflect on a time when their reaction to an emotion led to a negative outcome and explored how alternative responses using emotion regulation would have benefited them. Group members were also asked to discuss a time when emotion regulation was utilized when a negative emotion was experienced.  Patient invited but did not attend group.    Sallee LangeCunningham, Anne C 07/05/2014, 12:23 PM

## 2014-07-05 NOTE — Progress Notes (Signed)
Patient ID: Miranda Griffin, female   DOB: October 03, 1955, 58 y.o.   MRN: [id number]  [facility]  07/05/2014 3:46 PM Miranda Griffin  MRN:  [id number] Subjective: Patient reports she is doing " all right". She does continue to express psychotic symptoms and somatic preoccupations.  Objective:  Patient does continue to have active psychotic symptoms- she expresses that she is concerned " they " can read her thoughts via her L lacrimal gland. She is  Also worried she has cancer there- a very small nodule noted on inner aspect of eye lid.  There has been improvement , however- her dysphagia and concerns about food getting stuck and moving about in her thorax has resolved. She has also stopped experiencing any hallucinations. Behavior is in good control and she is calm on unit and polite upon approach. No disruptive or agitated behaviors. She states she is tolerating Risperidone well, which has been gradually titrated- most recently to 3 mgrs BID. She is not presenting with akathisia or EPS/dystonia. Although denies depression, staff continues to note a tendency towards sad and constricted affect. She denies any SI.   Diagnosis:  Psychosis NOS  Total Time spent with patient: 25 minutes   ADL's: improved  Sleep: Good   Appetite:  Good   Suicidal Ideation:  Denies any suicidal ideations at this time Homicidal Ideation:  Denies any homicidal ideations AEB (as evidenced by):  Psychiatric Specialty Exam: Physical Exam  Review of Systems  Constitutional: Negative for fever and chills.  Eyes: Negative.        Today does not report blurry vision or visual difficulties   Respiratory: Negative for cough and shortness of breath.   Cardiovascular: Negative for chest pain.  Gastrointestinal: Negative for nausea, vomiting and abdominal pain.  Genitourinary: Negative.   Skin: Negative for rash.  Neurological: Negative for seizures and loss of consciousness.  Psychiatric/Behavioral:  Positive for hallucinations. Negative for suicidal ideas.    Blood pressure 135/72, pulse 75, temperature 98.1 F (36.7 C), temperature source Oral, resp. rate 16, height 5' 1.5" (1.562 m), weight 71.215 kg (157 lb), last menstrual period 12/05/2009, SpO2 100 %.Body mass index is 29.19 kg/(m^2).  General Appearance: improved grooming  Eye Contact::  Good- she is polite and pleasant upon approach  Speech:  Normal Rate and improved  Volume:  Decreased  Mood:  describes mood as "OK"- denies sadness or depression  Affect:  reactive, pleasant, but does tend to be constricted in affect   Thought Process:  Linear and linear for the most part but does become slightly disorganized with open ended questions at times  Orientation:  Other:  fully alert and attentive  Thought Content:  today denies auditory or visual halls. (+) delusions still noted, but less preoccupied about these   Suicidal Thoughts:  No- at this time denies any suicidal ideations  Homicidal Thoughts:  No  Memory:  recent and remote grossly intact   Judgement:  Impaired  Insight:  Lacking  Psychomotor Activity:  normal  Concentration:  Good  Recall:  Good  Fund of Knowledge:Good  Language: Good  Akathisia:  No  Handed:  Right  AIMS (if indicated):     Assets:  Desire for Improvement Resilience  Sleep:  Number of Hours: 6.5   Musculoskeletal: Strength & Muscle Tone: within normal limits Gait & Station: normal Patient leans: N/A  Current Medications: Current Facility-Administered Medications  Medication Dose Route Frequency Provider Last Rate Last Dose  . acetaminophen (TYLENOL) tablet 650 mg  650 mg Oral Q6H PRN Kerry HoughSpencer E Simon, PA-C   650 mg at 06/28/14 [id number]  . alum & mag hydroxide-simeth (MAALOX/MYLANTA) 200-200-20 MG/5ML suspension 30 mL  30 mL Oral Q4H PRN Kerry HoughSpencer E Simon, PA-C      . benzocaine (ORAJEL) 10 % mucosal gel   Mouth/Throat TID PRN Beau FannyJohn C Withrow, FNP   1 application at 07/04/14 0908  . benztropine  (COGENTIN) tablet 1 mg  1 mg Oral BID Beau FannyJohn C Withrow, FNP   1 mg at 07/05/14 [id number]  . citalopram (CELEXA) tablet 20 mg  20 mg Oral Daily Craige CottaFernando A Alonza Knisley, MD   20 mg at 07/05/14 [id number]  . hydrALAZINE (APRESOLINE) tablet 10 mg  10 mg Oral 3 times per day Kerry HoughSpencer E Simon, PA-C   10 mg at 07/05/14 1403  . hydrochlorothiazide (HYDRODIURIL) tablet 25 mg  25 mg Oral Daily Saramma Eappen, MD   25 mg at 07/05/14 0829  . hydrOXYzine (ATARAX/VISTARIL) tablet 25 mg  25 mg Oral Q6H PRN Kerry HoughSpencer E Simon, PA-C   25 mg at 06/27/14 2209  . ibuprofen (ADVIL,MOTRIN) tablet 600 mg  600 mg Oral Q6H PRN Kerry HoughSpencer E Simon, PA-C   600 mg at 06/30/14 1539  . lisinopril (PRINIVIL,ZESTRIL) tablet 20 mg  20 mg Oral Daily Jomarie LongsSaramma Eappen, MD   20 mg at 07/05/14 0829  . magnesium hydroxide (MILK OF MAGNESIA) suspension 30 mL  30 mL Oral Daily PRN Kerry HoughSpencer E Simon, PA-C      . multivitamin with minerals tablet 1 tablet  1 tablet Oral Daily Kerry HoughSpencer E Simon, PA-C   1 tablet at 07/05/14 [id number]  . pravastatin (PRAVACHOL) tablet 40 mg  40 mg Oral q1800 Kerry HoughSpencer E Simon, PA-C   40 mg at 07/04/14 1704  . risperiDONE (RISPERDAL) tablet 3 mg  3 mg Oral BID Craige CottaFernando A Sharlett Lienemann, MD   3 mg at 07/05/14 0829  . traZODone (DESYREL) tablet 100 mg  100 mg Oral QHS,MR X 1 Beau FannyJohn C Withrow, FNP   100 mg at 07/04/14 2121    Lab Results:  No results found for this or any previous visit (from the past 48 hour(s)).  Physical Findings: AIMS: AIMS test performed- no EPS, no cogwheeling, no score on AIMS test ( no abnormal movements noted)  CIWA:    COWS:      Assessment: Improved compared to admission status and behavior calm and in control. Does continue to present with psychotic symptoms, however ( delusions - no longer hallucinating) . Tolerating Celexa and Risperidone well. Denies side effects. Denies SI.    Treatment Plan Summary: Daily contact with patient to assess and evaluate symptoms and progress in treatment Medication management See  below  Plan: Continue inpatient treatment, milieu, support  For now continue Risperidone  3 mgrs BID- dose was titrated yesterday Increase  Celexa  To 30  mgrs QDAY  Continue Trazodone  100mg   QHS Cogentin 1mg  bid  Medical Decision Making Problem Points:  Established problem, stable/improving (1), Review of last therapy session (1) and Review of psycho-social stressors (1) Data Points:  Review of medication regiment & side effects (2) Review of new medications or change in dosage (2)  I certify that inpatient services furnished can reasonably be expected to improve the patient's condition.   Hiroko Tregre,  07/05/2014, 10:09 AM

## 2014-07-05 NOTE — Progress Notes (Signed)
D: Patient appropriate and cooperative with staff. Patient has sad, depressed affect and mood. She reported on the self inventory sheet that her sleep and ability to concentrate are both good, appetite is fair and energy level is normal. Patient rates depression/feelings of hopelessness "2" and anxiety "4". Writer has observed that the patient has been resting in bed all day. She does not interact with peers on the hall. Patient adheres to medication regimen.  A: Support and encouragement provided to patient. Administered scheduled medications per ordering MD. Monitor Q15 minute checks for safety.  R: Patient receptive. Denies SI/HI/AVH. Patient remains safe on the unit.

## 2014-07-05 NOTE — Progress Notes (Signed)
The focus of this group is to help patients review their daily goal of treatment and discuss progress on daily workbooks. Pt did not attend the evening group. 

## 2014-07-05 NOTE — BHH Group Notes (Signed)
The focus of this group is to educate the patient on the purpose and policies of crisis stabilization and provide a format to answer questions about their admission.  The group details unit policies and expectations of patients while admitted. Patient did not attend this group. 

## 2014-07-06 MED ORDER — RISPERIDONE 3 MG PO TABS
3.0000 mg | ORAL_TABLET | Freq: Every morning | ORAL | Status: DC
Start: 2014-07-07 — End: 2014-07-07
  Administered 2014-07-07: 3 mg via ORAL
  Filled 2014-07-06 (×2): qty 1
  Filled 2014-07-06: qty 14

## 2014-07-06 MED ORDER — RISPERIDONE 2 MG PO TABS
4.0000 mg | ORAL_TABLET | Freq: Every day | ORAL | Status: DC
  Administered 2014-07-06: 4 mg via ORAL
  Filled 2014-07-06 (×3): qty 2
  Filled 2014-07-06: qty 28

## 2014-07-06 NOTE — Tx Team (Addendum)
Interdisciplinary Treatment Plan Update (Adult)   Date: 07/06/2014 Time Reviewed:10:02AM Progress in Treatment:  Attending groups: Intermittently   Participating in groups:  Minimally, when she attends  Taking medication as prescribed: Yes  Tolerating medication: Yes  Family/Significant othe contact made: SPE completed with pt's son/collateral info also obtained.    Patient understands diagnosis: Yes, AEB seeking treatment for AH, mood stabilization, and for med management.  Discussing patient identified problems/goals with staff: Yes  Medical problems stabilized or resolved: Yes  Denies suicidal/homicidal ideation: Yes during admission/self report.  Patient has not harmed self or Others: Yes  New problem(s) identified:  Discharge Plan or Barriers: Pt to return to [facility]. Her son is working with her to establish a safe place to live at d/c.  Additional comments: Propp is a 58 year old African American female that reports hearing voices of people cursing at her. Patient reports that the voices are curing her family. Patient reports that the voices are not telling her to harm herself or others. Patient reports that she has been hearing these voices for the past month. Patient reports that she hears the voices every day and states they sometimes call her name. Patient reports that they are not voices of people she knows and states they intermittently wake her up at night. Documentation in epic reports that that the patient stated that the voices tell her they are going to murder someone. Patient reports that she has experiences stress related to substandard living conditions at her apartment. Patient reports that she has to move out last Wednesday to the [employer] [employer] [place]. Patient denies a prior history of psychiatric hospitalization. Patient denies prior mental health treatment. Patient denies substance abuse. Patient BAL is <11 and there is not a UDS completed on the patient. Patient  denies SI and HI. Patient brought herself to the ED initially complaining of a sensation of something stuck in her throat. Patient reports that she has had this feeling intermittently since 02/24/14. Patient reports that she has difficulty speaking because it feels like something is blocking her throat.  11/6: pt continues to present with depressed mood and flat affect. Pt somatically focused, complaining of eye issues as her main problem for being at the hospital. Pt reports that she has not yet spoken to her son regarding where she will live at d/c. CSW encouraged pt to reach out to her son today. [facility]. She reports no problems with medications.   11/11: Pt continues to present with depressed mood and flat affect. Pt reports decreased AH, with continued mention of delusions (goverment putting her through suffocation ceremony, etc). Pt has not been attending groups over the past few days and has been isolating in her room/minimal interaction with other peers and staff. CSW encouraged pt to reach out to her son (who has no called CSW back after multiple messages) in order to verify that he put money in her account so that she can pay for extended stay hotel at d/c. [facility] and other [facility] health services. She was given Mental health association info and encouraged to follow-up there as well for additional support groups/peer support.   Reason for Continuation of Hospitalization: Mood stabilization AH/delusional thinking/symptoms of psychosis  Medication management  Estimated length of stay: 1-2 days    For review of initial/current patient goals, please see plan of care.  Attendees:  Patient:    Family:  Physician: Dr. Adela Glimpseabos MD  07/06/2014 11:19 AM   Nursing: Loleta BooksBritney, Patrice, Sarah RN 07/06/2014 11:19 AM   Clinical Social Worker Legrande Hao Smart, LCSWA  07/06/2014 11:19 AM   Other: Daryel Geraldodney North, LCSW  07/06/2014 11:19 AM   Other: Vikki PortsValerie, Care Coordinator   07/06/2014 11:19 AM   Other: Charleston Ropesandace Hyatt, CSW intern   07/06/2014 11:19 AM   Other:  07/06/2014 11:02AM  Scribe for Treatment Team:  Herbert SetaHeather Smart LCSWA 07/06/2014 11:22 AM   Pt and CSW reviewed pt's identified goals and treatment plan. Pt verbalized understanding and agreed to treatment plan.   The Sherwin-WilliamsHeather Smart, LCSWA 07/06/2014 11:22 AM

## 2014-07-06 NOTE — Progress Notes (Signed)
D: When asked about her day pt stated, "I feel like I'm getting better other than a few things that are still bothering me". Pt stated that she was informed by her dr that she would be sent home with prescriptions. When discussing discharge pt stated that she still hasn't been able to reach her son that responsible for her move. Stated that her SW was also unable to reach him. "I don't know where I will be living". Informed pt that she and should continue with her attempts.  A:  Support and encouragement was offered. 15 min checks continued for safety.  R: Pt remains safe.

## 2014-07-06 NOTE — Progress Notes (Addendum)
Patient ID: Miranda Griffin, female   DOB: 14-Sep-1955, 58 y.o.   MRN: [id number]  [facility] MD Progress Note  82/50/0370 4:88 PM Sekai Nayak  MRN:  [id number] Subjective: Patient states she is doing " all right". She is not endorsing any specific concerns or issues today. Objective:  I have discussed case with treatment team and have met with patient. Patient has improved compared to admission, and certainly hallucinations and intensity of delusions has improved significantly, but she does remain psychotic. This is only evident when specifically asked about her paranoid ideations, and she does not normally bring these up spontaneously. She continues to believe that some [place] federal agents or similar have been able to read her mind and have attempted to make her able to communicate with people from [place].  She has tolerated Risperidone well, and has not developed any side effects, no EPS or akatisia.  She has been calm and pleasant on unit, behavior in good control, no agitation. She is pleasant upon approach. She denies any depression or sadness and states her mood is " good" As discussed with staff/SW, son has reportedly sent money to her bank account to enable her to rent a place to stay after she leaves. ( Housing issues had been a major stressor prior to admission, and patient had been staying in a hotel) Patient had reported rash on forearms, but none noted today, she has two or three small lesions consistent with insect bites- no rash noted. EKG Normal    Diagnosis:  Psychosis NOS  Total Time spent with patient: 25 minutes   ADL's: improved  Sleep: Good   Appetite:  Good   Suicidal Ideation:  Denies any suicidal ideations at this time Homicidal Ideation:  Denies any homicidal ideations AEB (as evidenced by):  Psychiatric Specialty Exam: Physical Exam  Review of Systems  Constitutional: Negative for fever and chills.  Eyes: Negative.        Today does not report blurry vision  or visual difficulties   Respiratory: Negative for cough and shortness of breath.   Cardiovascular: Negative for chest pain.  Gastrointestinal: Negative for nausea, vomiting and abdominal pain.  Genitourinary: Negative.   Skin: Negative for rash.  Neurological: Negative for seizures and loss of consciousness.  Psychiatric/Behavioral: Positive for hallucinations. Negative for suicidal ideas.    Blood pressure 123/78, pulse 68, temperature 98 F (36.7 C), temperature source Oral, resp. rate 18, height 5' 1.5" (1.562 m), weight 71.215 kg (157 lb), last menstrual period 12/05/2009, SpO2 100 %.Body mass index is 29.19 kg/(m^2).  General Appearance: improved grooming  Eye Contact::  Good- she is polite and pleasant upon approach  Speech:  Normal Rate and improved  Volume:  Decreased  Mood:  describes mood as "OK"- denies sadness or depression  Affect:  reactive, pleasant, but does tend to be constricted in affect   Thought Process:  Linear and linear for the most part but does become slightly disorganized with open ended questions at times  Orientation:  Other:  fully alert and attentive  Thought Content:  today denies auditory or visual halls. (+) delusions still noted, but less preoccupied about these   Suicidal Thoughts:  No- at this time denies any suicidal ideations  Homicidal Thoughts:  No  Memory:  recent and remote grossly intact   Judgement:  Fair  Insight:  Lacking  Psychomotor Activity:  Normal, behavior in good control, no agitation  Concentration:  Good  Recall:  Good  Fund of Knowledge:Good  Language: Good  Akathisia:  No  Handed:  Right  AIMS (if indicated):     Assets:  Desire for Improvement Resilience  Sleep:  Number of Hours: 6.5   Musculoskeletal: Strength & Muscle Tone: within normal limits Gait & Station: normal Patient leans: N/A  Current Medications: Current Facility-Administered Medications  Medication Dose Route Frequency Provider Last Rate Last Dose   . acetaminophen (TYLENOL) tablet 650 mg  650 mg Oral Q6H PRN Laverle Hobby, PA-C   650 mg at 06/28/14 0743  . alum & mag hydroxide-simeth (MAALOX/MYLANTA) 200-200-20 MG/5ML suspension 30 mL  30 mL Oral Q4H PRN Laverle Hobby, PA-C      . benzocaine (ORAJEL) 10 % mucosal gel   Mouth/Throat TID PRN Benjamine Mola, FNP      . benztropine (COGENTIN) tablet 1 mg  1 mg Oral BID Benjamine Mola, FNP   1 mg at 07/06/14 0804  . citalopram (CELEXA) tablet 30 mg  30 mg Oral Daily Jenne Campus, MD   30 mg at 07/06/14 0804  . hydrALAZINE (APRESOLINE) tablet 10 mg  10 mg Oral 3 times per day Laverle Hobby, PA-C   10 mg at 07/06/14 1414  . hydrochlorothiazide (HYDRODIURIL) tablet 25 mg  25 mg Oral Daily Ursula Alert, MD   25 mg at 07/06/14 0804  . hydrOXYzine (ATARAX/VISTARIL) tablet 25 mg  25 mg Oral Q6H PRN Laverle Hobby, PA-C   25 mg at 06/27/14 2209  . ibuprofen (ADVIL,MOTRIN) tablet 600 mg  600 mg Oral Q6H PRN Laverle Hobby, PA-C   600 mg at 06/30/14 1539  . lisinopril (PRINIVIL,ZESTRIL) tablet 20 mg  20 mg Oral Daily Ursula Alert, MD   20 mg at 07/06/14 0804  . magnesium hydroxide (MILK OF MAGNESIA) suspension 30 mL  30 mL Oral Daily PRN Laverle Hobby, PA-C      . multivitamin with minerals tablet 1 tablet  1 tablet Oral Daily Laverle Hobby, PA-C   1 tablet at 07/06/14 0804  . pravastatin (PRAVACHOL) tablet 40 mg  40 mg Oral q1800 Laverle Hobby, PA-C   40 mg at 07/05/14 1701  . risperiDONE (RISPERDAL) tablet 3 mg  3 mg Oral BID Jenne Campus, MD   3 mg at 07/06/14 0804  . traZODone (DESYREL) tablet 100 mg  100 mg Oral QHS,MR X 1 Benjamine Mola, FNP   100 mg at 07/05/14 2130    Lab Results:  No results found for this or any previous visit (from the past 48 hour(s)).  Physical Findings: AIMS: AIMS test performed- no EPS, no cogwheeling, no score on AIMS test ( no abnormal movements noted)  CIWA:    COWS:      Assessment:Partial improvement compared to admission- less focused  on psychotic ruminations, less somatic, better related, and able to function better  in ADLs and unit routine, going to groups. Calm, behavior in good control. Tolerating Risperidone well. Based on partial improvement on Risperdal, and lack of side effects at current dose, will increase dose further to 7 mgrs a day ( 3 mgrs QAM and 4 mgrs QHS)     Treatment Plan Summary: Daily contact with patient to assess and evaluate symptoms and progress in treatment Medication management See below  Plan: Continue inpatient treatment, milieu, support  Increase Risperidone to 3 mgrs QAM and 4 mgrs QHS Continue Celexa  30  mgrs QDAY  Continue Trazodone  128m  QHS Continue Cogentin 146mbid  Medical Decision Making  Problem Points:  Established problem, stable/improving (1), Review of last therapy session (1) and Review of psycho-social stressors (1) Data Points:  Review of medication regiment & side effects (2) Review of new medications or change in dosage (2)  I certify that inpatient services furnished can reasonably be expected to improve the patient's condition.   COBOS, FERNANDO,  07/06/2014, 10:09 AM

## 2014-07-06 NOTE — Clinical Social Work Note (Signed)
CSW left another message for pt's son (Abayomi) in attempt to find out if he put money in her account/is assisting with moving her things. [phone]. Pt is worried about d/cing and not knowing where to go or where her belongings are.   The Sherwin-WilliamsHeather Smart, LCSWA 07/06/2014 3:47 PM

## 2014-07-06 NOTE — B[facility] Group Notes (Signed)
B[facility] LCSW Group Therapy  07/06/2014 1:29 PM  Type of Therapy:  Group Therapy  Participation Level: Invited. Did Not Attend  Summary of Progress/Problems: Mental Health Association [facility](MHA) speaker came to talk about his personal journey with substance abuse and mental illness. Group members were challenged to process ways by which to relate to the speaker. MHA speaker provided handouts and educational information pertaining to groups and services offered by the [facility].   Hyatt,Candace 07/06/2014, 1:29 PM

## 2014-07-06 NOTE — Progress Notes (Signed)
D: Pt presents with flat affect and depressed mood. Pt has minimal interaction on the milieu and little engagement with peers. Pt rates depression 3/10. Hopeless 4/10. Anxiety 5/10. Pt reports decreased auditory hallucinations. Pt do not appear to be paranoid this morning. Pt compliant with taking meds this morning. No complaints verbalized by pt.  A: Medications administered as ordered per MD. Verbal support given. Pt encouraged to attend groups 15 minute checks performed for safety.  R: Pt receptive to treatment. Pt safety maintained at this time.

## 2014-07-06 NOTE — Progress Notes (Signed)
Patient ID: Miranda Griffin, female   DOB: 1956/02/27, 58 y.o.   MRN: [id number]   D: Pt was laying in bed during the assessment. When asked about her day pt stated, "I'm fine, just a little bit tired". Pt denied having any questions for concerns surrounding her adm. Writer informed the pt of a scheduled EKG.  A: Writer performed EKG. Support and encouragement was offered. 15 min checks continued for safety.  R: Pt remains safe.

## 2014-07-06 NOTE — Progress Notes (Signed)
Adult Psychoeducational Group Note  Date:  07/06/2014 Time:  8:47 PM  Group Topic/Focus:  Wrap-Up Group:   The focus of this group is to help patients review their daily goal of treatment and discuss progress on daily workbooks.  Participation Level:  Minimal  Participation Quality:  Attentive  Affect:  Appropriate  Cognitive:  Appropriate  Insight: Appropriate  Engagement in Group:  Improving  Modes of Intervention:  Discussion  Additional CommentsThe patient expressed that in group she learned ,people can survive after suffering an illness.  Octavio Mannshigpen, Markail Diekman Lee 07/06/2014, 8:47 PM

## 2014-07-06 NOTE — [facility] Group Notes (Signed)
[facility]   07/06/2014 11:00 AM  Participation Quality:  Invited-Did not Attend   Smart, Lebron QuamHeather LCSWA

## 2014-07-07 DIAGNOSIS — F200 Paranoid schizophrenia: Secondary | ICD-10-CM | POA: Insufficient documentation

## 2014-07-07 DIAGNOSIS — F209 Schizophrenia, unspecified: Secondary | ICD-10-CM

## 2014-07-07 MED ORDER — LISINOPRIL 20 MG PO TABS: 20.0000 mg | ORAL_TABLET | Freq: Every day | ORAL | Status: DC

## 2014-07-07 MED ORDER — CITALOPRAM HYDROBROMIDE 10 MG PO TABS
30.0000 mg | ORAL_TABLET | Freq: Every day | ORAL | Status: DC
  Filled 2014-07-07 (×3): qty 42

## 2014-07-07 MED ORDER — HYDROCHLOROTHIAZIDE 25 MG PO TABS: 25.0000 mg | ORAL_TABLET | Freq: Every day | ORAL | Status: DC

## 2014-07-07 MED ORDER — TRAZODONE HCL 100 MG PO TABS: 100.0000 mg | ORAL_TABLET | Freq: Every evening | ORAL | Status: DC | PRN

## 2014-07-07 MED ORDER — CITALOPRAM HYDROBROMIDE 10 MG PO TABS: 30.0000 mg | ORAL_TABLET | Freq: Every day | ORAL | Status: DC

## 2014-07-07 MED ORDER — BENZTROPINE MESYLATE 1 MG PO TABS: 1.0000 mg | ORAL_TABLET | Freq: Two times a day (BID) | ORAL | Status: DC

## 2014-07-07 MED ORDER — RISPERIDONE 3 MG PO TABS: 3.0000 mg | ORAL_TABLET | Freq: Every morning | ORAL | Status: DC

## 2014-07-07 MED ORDER — HYDRALAZINE HCL 10 MG PO TABS: 10.0000 mg | ORAL_TABLET | Freq: Three times a day (TID) | ORAL | Status: DC

## 2014-07-07 MED ORDER — PRAVASTATIN SODIUM 40 MG PO TABS: 40.0000 mg | ORAL_TABLET | Freq: Every day | ORAL | Status: DC

## 2014-07-07 MED ORDER — HYDROXYZINE HCL 25 MG PO TABS: 25.0000 mg | ORAL_TABLET | Freq: Four times a day (QID) | ORAL | Status: DC | PRN

## 2014-07-07 MED ORDER — RISPERIDONE 4 MG PO TABS: 4.0000 mg | ORAL_TABLET | Freq: Every day | ORAL | Status: DC

## 2014-07-07 MED ORDER — VITAMIN B12 100 MCG PO TABS: 100.0000 ug | ORAL_TABLET | Freq: Every day | ORAL | Status: DC

## 2014-07-07 MED ORDER — HYDROCHLOROTHIAZIDE 25 MG PO TABS
25.0000 mg | ORAL_TABLET | Freq: Every day | ORAL | Status: DC
  Filled 2014-07-07 (×3): qty 14

## 2014-07-07 MED ORDER — ADULT MULTIVITAMIN W/MINERALS CH: 1.0000 | ORAL_TABLET | Freq: Every day | ORAL | Status: DC

## 2014-07-07 NOTE — Progress Notes (Signed)
[facility] :  Will you be returning to the same living situation after discharge: No.[facility] [place]. At discharge, do you have transportation home?:Yes,  bus pass/part bus money in chart Do you have the ability to pay for your medications:Yes,  mental health  Release of information consent forms completed and submitted to medical records by CSW.  Patient to Follow up at: Follow-up Information    Follow up with [facility].   Why:  Walk in between 8am-9am Monday through Friday for hospital follow-up/medication management/assessment for therapy services.    Contact information:   [address] [place], [place] [address] Phone: [phone] Fax: [phone]      Patient denies SI/HI:   Yes,  during group/self report.    Safety Planning and Suicide Prevention discussed:  Yes,  SPE completed with pt's son. SPI pamphlet provided to pt and she was encouraged to share information with support network, ask questions, and talk about any concerns relating to SPE.  Smart, Tavie Haseman LCSWA  07/07/2014, 11:31 AM

## 2014-07-07 NOTE — Clinical Social Work Note (Signed)
CSW contacted multiple shelters in [place]. [facility] [place], [place]. Pt provided with this info, [facility] info, low income housing and extended stay hotel lists. Pt informed that her children moved her belongings to storage unit for safe keeping until they find her permanent living arrangements. Pt verbalized understanding of above info and is open to going to shelter at this point. Bus pass/part bus money provided and in pt chart. Pt can d/c after lunch per Dr. Elna BreslowEappen.   The Sherwin-WilliamsHeather Smart, LCSWA 07/07/2014 11:31 AM

## 2014-07-07 NOTE — Clinical Social Work Note (Signed)
CSW spoke with pt's son. He stated that his sister moved Miranda Griffin' belongings into a storage unit for the time being and is recommending that she go to a shelter until they can find her another place to live, as they are refusing to send her money.  The Sherwin-WilliamsHeather Griffin, LCSWA 07/07/2014 9:21 AM

## 2014-07-07 NOTE — Discharge Summary (Signed)
Physician Discharge Summary Note  Patient:  Miranda Griffin is an [age] y.o., female MRN:  [id number] DOB:  February 14, 1956 Patient phone:  [phone] (home)  Patient address:   [address] [place] [address],  Total Time spent with patient: 30 minutes  Date of Admission:  06/27/2014 Date of Discharge: 07/04/14  Reason for Admission:  Psychosis  Discharge Diagnoses: Primary Psychiatric Diagnosis: Schizophrenia ,acute episode (resolved)  Active Problems:   Psychosis   Psychoses   Paranoid schizophrenia   Psychiatric Specialty Exam: Physical Exam  Vitals reviewed. Psychiatric: She has a normal mood and affect. Her speech is normal and behavior is normal. Judgment and thought content normal. Cognition and memory are normal.    Review of Systems  Psychiatric/Behavioral: Positive for depression (stabilized, chronic). Negative for suicidal ideas, hallucinations, memory loss and substance abuse. The patient is not nervous/anxious and does not have insomnia.     Blood pressure 134/69, pulse 90, temperature 98.6 F (37 C), temperature source Oral, resp. rate 18, height 5' 1.5" (1.562 m), weight 71.215 kg (157 lb), last menstrual period 12/05/2009, SpO2 100 %.Body mass index is 29.19 kg/(m^2).   Past Psychiatric History: Diagnosis: Patient denies any prior psychiatric history, denies history of severe depression, denies history of mania, denies prior history of psychosis, denies history of GAD or OCD, denies history of Panic or Agoraphobia. Endorses PTSD type symptoms which improved over the years  Hospitalizations: denies prior psychiatric admissions  Outpatient Care: none   Substance Abuse Care: denies any substance abuse history  Self-Mutilation: denies   Suicidal Attempts: denies   Violent Behaviors: denies    Musculoskeletal: Strength & Muscle Tone: within normal limits Gait & Station: normal Patient leans: N/A   DSM 5:  Primary Psychiatric  Diagnosis: Schizophrenia ,acute episode (resolved)   Past Medical History  Diagnosis Date  . Hypertension   . Hyperlipidemia   . Anemia 2008    Now resolved, baseline- 12.5  . Heme positive stool 5/10    Colonoscopy 02/2011 showing only internal hemorrhoids  . Internal hemorrhoids     per colonoscopy 02/2011   Level of Care:  OP  Hospital Course:  Patient is 58 years old. She is separated and has adult children. She lives alone. She states that over the last three to four months she has had people coming to her home , identifying themselves as " [employer] " and have conducted a " suffocation ceremony" that she does not elaborate on. She states she cannot see these people , but can hear them. They have also told her not to leave her home or neighborhood.  Patient also reports a feeling that food " gets stuck somewhere near my throat" but then seems to travel internally to her shoulder area. As per chart notes, patient has been facing stressor of living in a substandard apartment, and having to move to a motel recently. Patient herself does not mention this on this assessment. Patient denied any prior history of psychosis, patient also denied any drug or alcohol use.  Patient states she had been feeling " a little depressed", but denied any symptoms of severe depression * of note, a Head CT Scan was negative for acute findings.  EKG normal.  To help with Tranise's mood crisis, plan included daily contact with patient to assess and evaluate symptoms and progress in treatment.  Medication management included increasing Risperidone to 3 mgrs QAM and 4 mgrs which gave better symptom control.  Celexa 30 mg was given and Trazodone  100 mg to help with sleep.  Patient was placed on Cogentin 1 mg to control possible EPS, there was none reported.   Patient had improved compared to admission, and certainly hallucinations and intensity of delusions has improved significantly.  She continues to  believe that some [place] federal agents or similar have been able to read her mind and have attempted to make her able to communicate with people from afar.  She has been calm and pleasant on unit, behavior in good control, no agitation. She is pleasant upon approach.  Patient denied SI/HI/AVH at time of discharge.  Consults:  psychiatry  Significant Diagnostic Studies:  labs: per ED  Discharge Vitals:   Blood pressure 134/69, pulse 90, temperature 98.6 F (37 C), temperature source Oral, resp. rate 18, height 5' 1.5" (1.562 m), weight 71.215 kg (157 lb), last menstrual period 12/05/2009, SpO2 100 %. Body mass index is 29.19 kg/(m^2). Lab Results:   No results found for this or any previous visit (from the past 72 hour(s)).  Physical Findings: AIMS: Facial and Oral Movements Muscles of Facial Expression: None, normal Lips and Perioral Area: None, normal Jaw: None, normal Tongue: None, normal,Extremity Movements Upper (arms, wrists, hands, fingers): None, normal Lower (legs, knees, ankles, toes): None, normal, Trunk Movements Neck, shoulders, hips: None, normal, Overall Severity Severity of abnormal movements (highest score from questions above): None, normal Incapacitation due to abnormal movements: None, normal Patient's awareness of abnormal movements (rate only patient's report): No Awareness, Dental Status Current problems with teeth and/or dentures?: No Does patient usually wear dentures?: No  CIWA:    COWS:     Psychiatric Specialty Exam: See Psychiatric Specialty Exam and Suicide Risk Assessment completed by Attending Physician prior to discharge.  Discharge destination:  Home  Is patient on multiple antipsychotic therapies at discharge:  No   Has Patient had three or more failed trials of antipsychotic monotherapy by history:  No  Recommended Plan for Multiple Antipsychotic Therapies: NA     Medication List    STOP taking these medications         lisinopril-hydrochlorothiazide 20-25 MG per tablet  Commonly known as:  PRINZIDE,ZESTORETIC      TAKE these medications      Indication   benztropine 1 MG tablet  Commonly known as:  COGENTIN  Take 1 tablet (1 mg total) by mouth 2 (two) times daily.   Indication:  Extrapyramidal Reaction caused by Medications     citalopram 10 MG tablet  Commonly known as:  CELEXA  Take 3 tablets (30 mg total) by mouth daily.   Indication:  Depression     hydrALAZINE 10 MG tablet  Commonly known as:  APRESOLINE  Take 1 tablet (10 mg total) by mouth every 8 (eight) hours.   Indication:  High Blood Pressure     hydrochlorothiazide 25 MG tablet  Commonly known as:  HYDRODIURIL  Take 1 tablet (25 mg total) by mouth daily.   Indication:  High Blood Pressure     hydrOXYzine 25 MG tablet  Commonly known as:  ATARAX/VISTARIL  Take 1 tablet (25 mg total) by mouth every 6 (six) hours as needed for anxiety.      ibuprofen 200 MG tablet  Commonly known as:  ADVIL,MOTRIN  Take 600 mg by mouth every 6 (six) hours as needed (for pain).      lisinopril 20 MG tablet  Commonly known as:  PRINIVIL,ZESTRIL  Take 1 tablet (20 mg total) by mouth daily.   Indication:  High Blood Pressure     multivitamin with minerals Tabs tablet  Take 1 tablet by mouth daily.   Indication:  Health maintenance     pravastatin 40 MG tablet  Commonly known as:  PRAVACHOL  Take 1 tablet (40 mg total) by mouth daily at 6 PM.   Indication:  Hyperlipidemia     risperiDONE 3 MG tablet  Commonly known as:  RISPERDAL  Take 1 tablet (3 mg total) by mouth every morning.  Notes to Patient:  PLEASE NOTE THAT YOU HAVE TWO DIFFERENT DOSES FOR MORNING AND AT BEDTIME.     Indication:  Mood Stabilization     risperidone 4 MG tablet  Commonly known as:  RISPERDAL  Take 1 tablet (4 mg total) by mouth at bedtime.  Notes to Patient:  PLEASE NOTE THAT YOU HAVE TWO DIFFERENT DOSES FOR MORNING AND AT BEDTIME.    Indication:  Mood  Stabilization     traZODone 100 MG tablet  Commonly known as:  DESYREL  Take 1 tablet (100 mg total) by mouth at bedtime and may repeat dose one time if needed.   Indication:  Trouble Sleeping     vitamin B-12 100 MCG tablet  Commonly known as:  CYANOCOBALAMIN  Take 1 tablet (100 mcg total) by mouth daily.   Indication:  Inadequate Vitamin B12       Follow-up Information    Follow up with [facility].   Why:  Walk in between 8am-9am Monday through Friday for hospital follow-up/medication management/assessment for therapy services.    Contact information:   [address] [place], [place] [address] Phone: [phone] Fax: [phone]      Follow-up recommendations:  Activity:  as tolerated, diet as tolerated  Comments:  1.  Take all your medications as prescribed.              2.  Report any adverse side effects to outpatient provider.                       3.  Patient instructed to not use alcohol or illegal drugs while on prescription medicines.            4.  In the event of worsening symptoms, instructed patient to call 911, the crisis hotline or go to nearest emergency room for evaluation of symptoms.  Total Discharge Time:  Greater than 30 minutes.  SignedAdonis Brook: AGUSTIN, SHEILA MAY, AGNP-BC 07/07/2014, 3:49 PM   Patient was seen face to face for psychiatric evaluation, suicide risk assessment and case discussed with treatment team and NP and made appropriate disposition plans. Reviewed the information documented and agree with the treatment plan.    Jomarie LongsSaramma Karson Reede ,MD Attending Psychiatrist  [facility]

## 2014-07-07 NOTE — [facility] Suicide Risk Assessment (Signed)
   Demographic Factors:  Divorced or widowed, Low socioeconomic status and Unemployed  Total Time spent with patient: 45 minutes  Psychiatric Specialty Exam: Physical Exam  ROS  Blood pressure 134/69, pulse 90, temperature 98.6 F (37 C), temperature source Oral, resp. rate 18, height 5' 1.5" (1.562 m), weight 71.215 kg (157 lb), last menstrual period 12/05/2009, SpO2 100 %.Body mass index is 29.19 kg/(m^2).  General Appearance: Casual  Eye Contact::  Fair  Speech:  Clear and Coherent  Volume:  Normal  Mood:  Euthymic  Affect:  Appropriate  Thought Process:  Linear  Orientation:  Full (Time, Place, and Person)  Thought Content:  Delusions - has chronic delusions at baseline  Suicidal Thoughts:  No  Homicidal Thoughts:  No  Memory:  Immediate;   Fair Recent;   Fair Remote;   Fair  Judgement:  Fair  Insight:  Fair  Psychomotor Activity:  Normal  Concentration:  Fair  Recall:  FiservFair  Fund of Knowledge:Fair  Language: Good  Akathisia:  No  Handed:  Right  AIMS (if indicated):     Assets:  Communication Skills Desire for Improvement  Sleep:  Number of Hours: 6.5    Musculoskeletal: Strength & Muscle Tone: within normal limits Gait & Station: normal Patient leans: N/A   Mental Status Per Nursing Assessment::   On Admission:  NA  Current Mental Status by Physician: Patient is alert ,oriented x3,denies AH/VH ,but has chronic delusions at baseline  Loss Factors: Financial problems/change in socioeconomic status  Historical Factors: NA  Risk Reduction Factors:   Religious beliefs about death  Continued Clinical Symptoms:  Previous Psychiatric Diagnoses and Treatments  Cognitive Features That Contribute To Risk:  Patient is alert,oriented x3    Suicide Risk:  Minimal: No identifiable suicidal ideation.   Discharge Diagnoses:  Primary Psychiatric Diagnosis: Schizophrenia ,acute episode (resolved)    Non Psychiatric Diagnosis: See PMH Past Medical  History  Diagnosis Date  . Hypertension   . Hyperlipidemia   . Anemia 2008    Now resolved, baseline- 12.5  . Heme positive stool 5/10    Colonoscopy 02/2011 showing only internal hemorrhoids  . Internal hemorrhoids     per colonoscopy 02/2011    Plan Of Care/Follow-up recommendations:  Activity:  NO restrictions Diet:  regular  Is patient on multiple antipsychotic therapies at discharge:  No   Has Patient had three or more failed trials of antipsychotic monotherapy by history:  No  Recommended Plan for Multiple Antipsychotic Therapies: NA    Karlina Suares MD 07/07/2014, 11:15 AM

## 2014-07-07 NOTE — Progress Notes (Signed)
Discharge Note: Discharge instructions/prescriptions/medication samples given to patient. Patient verbalized understanding of discharge instructions and prescriptions. Returned belongings to patient. Denies SI/HI/AVH. Patient d/c without incident to the lobby and [place]

## 2014-07-12 NOTE — Progress Notes (Signed)
Patient Discharge Instructions:  After Visit Summary (AVS):   Faxed to:  07/12/14 Discharge Summary Note:   Faxed to:  07/12/14 Psychiatric Admission Assessment Note:   Faxed to:  07/12/14 Suicide Risk Assessment - Discharge Assessment:   Faxed to:  07/12/14 Faxed/Sent to the Next Level Care provider:  07/12/14  [facility] @ [phone]   Jerelene ReddenSheena E Mooreland, 07/12/2014, 1:54 PM

## 2015-10-16 ENCOUNTER — Emergency Department (HOSPITAL_COMMUNITY)
Admission: EM | Admit: 2015-10-16 | Discharge: 2015-10-16 | Disposition: A | Discharge status: Home / Self Care | Payer: Self-pay | Attending: Emergency Medicine | Admitting: Emergency Medicine

## 2015-10-16 ENCOUNTER — Encounter (HOSPITAL_COMMUNITY): Payer: Self-pay

## 2015-10-16 DIAGNOSIS — E785 Hyperlipidemia, unspecified: Secondary | ICD-10-CM | POA: Insufficient documentation

## 2015-10-16 DIAGNOSIS — Z79899 Other long term (current) drug therapy: Secondary | ICD-10-CM | POA: Insufficient documentation

## 2015-10-16 DIAGNOSIS — Y9289 Other specified places as the place of occurrence of the external cause: Secondary | ICD-10-CM | POA: Insufficient documentation

## 2015-10-16 DIAGNOSIS — Z8719 Personal history of other diseases of the digestive system: Secondary | ICD-10-CM | POA: Insufficient documentation

## 2015-10-16 DIAGNOSIS — T148XXA Other injury of unspecified body region, initial encounter: Secondary | ICD-10-CM

## 2015-10-16 DIAGNOSIS — Y9389 Activity, other specified: Secondary | ICD-10-CM | POA: Insufficient documentation

## 2015-10-16 DIAGNOSIS — D649 Anemia, unspecified: Secondary | ICD-10-CM | POA: Insufficient documentation

## 2015-10-16 DIAGNOSIS — X58XXXA Exposure to other specified factors, initial encounter: Secondary | ICD-10-CM | POA: Insufficient documentation

## 2015-10-16 DIAGNOSIS — I10 Essential (primary) hypertension: Secondary | ICD-10-CM | POA: Insufficient documentation

## 2015-10-16 DIAGNOSIS — S76011A Strain of muscle, fascia and tendon of right hip, initial encounter: Secondary | ICD-10-CM | POA: Insufficient documentation

## 2015-10-16 DIAGNOSIS — Y999 Unspecified external cause status: Secondary | ICD-10-CM | POA: Insufficient documentation

## 2015-10-16 MED ORDER — METHOCARBAMOL 500 MG PO TABS: 500.0000 mg | ORAL_TABLET | Freq: Two times a day (BID) | ORAL | Status: DC | PRN

## 2015-10-16 MED ORDER — IBUPROFEN 800 MG PO TABS: 800.0000 mg | ORAL_TABLET | Freq: Three times a day (TID) | ORAL | Status: DC

## 2015-10-16 NOTE — ED Notes (Signed)
Declined W/C at D/C and was escorted to lobby by RN. 

## 2015-10-16 NOTE — ED Notes (Signed)
Patient here with right hip pain x 1 day, denies injury, pain with ambulation

## 2015-10-16 NOTE — ED Provider Notes (Signed)
CSN: [id number]     Arrival date & time 10/16/15  1017 History  By signing my name below, I, Ronney Lion, attest that this documentation has been prepared under the direction and in the presence of [facility], PA-C. Electronically Signed: Ronney Lion, ED Scribe. 10/16/2015. 2:13 PM.    Chief Complaint  Patient presents with  . Hip Pain   The history is provided by the patient. No language interpreter was used.    HPI Comments: Miranda Griffin is a 60 y.o. female with a history of hypertension and hyperlipidemia, who presents to the Emergency Department complaining of atraumatic, non-radiating, constant, right hip pain that began 1 day ago. She states she had right knee surgery in 2011 although she denies any complaints with her knee. Patient is able to ambulate without difficulty. This is a new problem. She denies dysuria, nausea, or abdominal pain. No b/b incontinence, saddle anesthesia, fever, numbness/tingling.    Past Medical History  Diagnosis Date  . Hypertension   . Hyperlipidemia   . Anemia 2008    Now resolved, baseline- 12.5  . Heme positive stool 5/10    Colonoscopy 02/2011 showing only internal hemorrhoids  . Internal hemorrhoids     per colonoscopy 02/2011   Past Surgical History  Procedure Laterality Date  . Knee surgery    . Cesarean section      x2, 1989, 1990   Family History  Problem Relation Age of Onset  . Hypertension Other   . Hyperlipidemia Other   . Hypertension Father   . Vision loss Father   . Vision loss Paternal Grandmother    Social History  Substance Use Topics  . Smoking status: Never Smoker   . Smokeless tobacco: Never Used  . Alcohol Use: Yes     Comment: occasional wine   OB History    No data available     Review of Systems  Constitutional: Negative for fever and chills.  Gastrointestinal: Negative for nausea and abdominal pain.  Genitourinary: Negative for dysuria.  Musculoskeletal: Positive for arthralgias (right hip pain).   Skin: Negative for rash and wound.      Allergies  Review of patient's allergies indicates no known allergies.  Home Medications   Prior to Admission medications   Medication Sig Start Date End Date Taking? Authorizing Provider  benztropine (COGENTIN) 1 MG tablet Take 1 tablet (1 mg total) by mouth 2 (two) times daily. 07/07/14   Adonis Brook, NP  citalopram (CELEXA) 10 MG tablet Take 3 tablets (30 mg total) by mouth daily. 07/07/14   Adonis Brook, NP  hydrALAZINE (APRESOLINE) 10 MG tablet Take 1 tablet (10 mg total) by mouth every 8 (eight) hours. 07/07/14   Adonis Brook, NP  hydrochlorothiazide (HYDRODIURIL) 25 MG tablet Take 1 tablet (25 mg total) by mouth daily. 07/07/14   Adonis Brook, NP  hydrOXYzine (ATARAX/VISTARIL) 25 MG tablet Take 1 tablet (25 mg total) by mouth every 6 (six) hours as needed for anxiety. 07/07/14   Adonis Brook, NP  ibuprofen (ADVIL,MOTRIN) 800 MG tablet Take 1 tablet (800 mg total) by mouth 3 (three) times daily. 10/16/15   Chase Picket Ward, PA-C  lisinopril (PRINIVIL,ZESTRIL) 20 MG tablet Take 1 tablet (20 mg total) by mouth daily. 07/07/14   Adonis Brook, NP  methocarbamol (ROBAXIN) 500 MG tablet Take 1 tablet (500 mg total) by mouth 2 (two) times daily as needed for muscle spasms. 10/16/15   Chase Picket Ward, PA-C  Multiple Vitamin (MULTIVITAMIN WITH MINERALS) TABS tablet  Take 1 tablet by mouth daily. 07/07/14   Adonis Brook, NP  pravastatin (PRAVACHOL) 40 MG tablet Take 1 tablet (40 mg total) by mouth daily at 6 PM. 07/07/14   Adonis Brook, NP  risperiDONE (RISPERDAL) 3 MG tablet Take 1 tablet (3 mg total) by mouth every morning. 07/07/14   Adonis Brook, NP  risperiDONE (RISPERDAL) 4 MG tablet Take 1 tablet (4 mg total) by mouth at bedtime. 07/07/14   Adonis Brook, NP  traZODone (DESYREL) 100 MG tablet Take 1 tablet (100 mg total) by mouth at bedtime and may repeat dose one time if needed. 07/07/14   Adonis Brook, NP  vitamin B-12  (CYANOCOBALAMIN) 100 MCG tablet Take 1 tablet (100 mcg total) by mouth daily. 07/07/14   Adonis Brook, NP   BP 184/88 mmHg  Pulse 67  Temp(Src) 98 F (36.7 C) (Oral)  Resp 16  Ht  (1.575 m)  Wt 72.576 kg  BMI 29.26 kg/m2  SpO2 100%  LMP 12/05/2009 Physical Exam  Constitutional: She is oriented to person, place, and time. She appears well-developed and well-nourished. No distress.  NAD  HENT:  Head: Normocephalic and atraumatic.  Neck:  Full ROM without pain No midline tenderness No tenderness of paraspinal musculature  Cardiovascular: Normal rate, regular rhythm, normal heart sounds and intact distal pulses.  Exam reveals no gallop and no friction rub.   No murmur heard. Pulmonary/Chest: Effort normal and breath sounds normal. No respiratory distress. She has no wheezes. She has no rales.  Abdominal: Soft. Bowel sounds are normal. She exhibits no distension. There is no tenderness.  No CVA tenderness.  Musculoskeletal: Normal range of motion.       Arms: Gait is not antalgic No noted deformities or signs of inflammation. No overlying skin changes. Curvature of cervical, thoracic, and lumbar spine within normal limits. No midline tenderness; tenderness to palpation as depicted in image. Full ROM Straight leg raises are negative  5/5 muscle strength of bilateral LE's  Negative FABER, FADIR  Neurological: She is alert and oriented to person, place, and time. She has normal reflexes.  Bilateral lower extremities neurovascularly intact.  Skin: Skin is warm and dry. No rash noted. No erythema.  Psychiatric: She has a normal mood and affect. Her behavior is normal.  Nursing note and vitals reviewed.   ED Course  Procedures (including critical care time)  DIAGNOSTIC STUDIES: Oxygen Saturation is 99% on RA, normal by my interpretation.    COORDINATION OF CARE: 1:40 PM - Discussed treatment plan with pt at bedside which includes anti-inflammatory medication and muscle  relaxants. Cautioned pt about sedating side effects of medication. Will give orthopedic referral for pt to use if her symptoms do not improve in 1 week. Pt verbalized understanding and agreed to plan.    MDM   Final diagnoses:  Muscle strain   Fulton Mole Goetzke presents with "hip pain" which on exam appears to be right lumbar back pain. No red flag sxs of low back pain. Normal neuro exam. No loss of bowel or bladder control. No concern for cauda equina. No fever, night sweats, weight loss, h/o cancer, IVDU. Lower extremities are neurovascularly intact and patient is ambulating without difficulty. Patient was counseled on back pain precautions and told to do activity as tolerated but do not lift, push, or pull heavy objects more than 10 pounds for the next week. Patient counseled to use ice or heat on back for no longer than 15 minutes every hour.   Patient prescribed  muscle relaxer and counseled on proper use of these medications. Urged patient not to drink alcohol, drive, or perform any other activities that requires focus while taking either of these medications.   Patient urged to follow-up with PCP if pain does not improve with treatment and rest or if pain becomes recurrent. Urged to return with worsening severe pain, loss of bowel or bladder control, trouble walking.   The patient verbalizes understanding and agrees with the plan.  I personally performed the services described in this documentation, which was scribed in my presence. The recorded information has been reviewed and is accurate.   [facility] Ward, PA-C 10/16/15 1417  Lorre Nick, MD 10/17/15 [id number]

## 2015-10-16 NOTE — Discharge Instructions (Signed)
1. Medications: Ibuprofen is your anti-inflammatory, robaxin is your muscle relaxer - This can make you very drowsy - please do not drink or drive on this medication, continue usual home medications 2. Treatment: rest, drink plenty of fluids, ice affected area (instructions below)  3. Follow Up: Please follow up with the orthopedic clinic listed if no improvement in symptoms after 7 days for discussion of your diagnoses and further evaluation after today's visit; Please return to the ER for new or worsening symptoms, any additional concerns.   COLD THERAPY DIRECTIONS:  Ice or gel packs can be used to reduce both pain and swelling. Ice is the most helpful within the first 24 to 48 hours after an injury or flareup from overusing a muscle or joint.  Ice is effective, has very few side effects, and is safe for most people to use.   If you expose your skin to cold temperatures for too long or without the proper protection, you can damage your skin or nerves. Watch for signs of skin damage due to cold.   HOME CARE INSTRUCTIONS  Follow these tips to use ice and cold packs safely.  Place a dry or damp towel between the ice and skin. A damp towel will cool the skin more quickly, so you may need to shorten the time that the ice is used.  For a more rapid response, add gentle compression to the ice.  Ice for no more than 10 to 20 minutes at a time. The bonier the area you are icing, the less time it will take to get the benefits of ice.  Check your skin after 5 minutes to make sure there are no signs of a poor response to cold or skin damage.  Rest 20 minutes or more in between uses.  Once your skin is numb, you can end your treatment. You can test numbness by very lightly touching your skin. The touch should be so light that you do not see the skin dimple from the pressure of your fingertip. When using ice, most people will feel these normal sensations in this order: cold, burning, aching, and numbness.

## 2016-05-14 ENCOUNTER — Emergency Department (HOSPITAL_COMMUNITY)
Admission: EM | Admit: 2016-05-14 | Discharge: 2016-05-15 | Disposition: A | Discharge status: Other Acute Inpatient Hospital | Payer: Self-pay | Attending: Emergency Medicine | Admitting: Emergency Medicine

## 2016-05-14 ENCOUNTER — Encounter (HOSPITAL_COMMUNITY): Payer: Self-pay

## 2016-05-14 DIAGNOSIS — R440 Auditory hallucinations: Secondary | ICD-10-CM | POA: Insufficient documentation

## 2016-05-14 DIAGNOSIS — N390 Urinary tract infection, site not specified: Secondary | ICD-10-CM

## 2016-05-14 DIAGNOSIS — Z79899 Other long term (current) drug therapy: Secondary | ICD-10-CM | POA: Insufficient documentation

## 2016-05-14 DIAGNOSIS — I10 Essential (primary) hypertension: Secondary | ICD-10-CM | POA: Insufficient documentation

## 2016-05-14 LAB — URINALYSIS, ROUTINE W REFLEX MICROSCOPIC
BILIRUBIN URINE: NEGATIVE
Glucose, UA: NEGATIVE mg/dL
KETONES UR: NEGATIVE mg/dL
Nitrite: NEGATIVE
PH: 7 (ref 5.0–8.0)
Protein, ur: NEGATIVE mg/dL
SPECIFIC GRAVITY, URINE: 1.006 (ref 1.005–1.030)

## 2016-05-14 LAB — COMPREHENSIVE METABOLIC PANEL
ALBUMIN: 4 g/dL (ref 3.5–5.0)
ALT: 13 U/L — ABNORMAL LOW (ref 14–54)
ANION GAP: 7 (ref 5–15)
AST: 18 U/L (ref 15–41)
Alkaline Phosphatase: 115 U/L (ref 38–126)
BILIRUBIN TOTAL: 0.5 mg/dL (ref 0.3–1.2)
BUN: 9 mg/dL (ref 6–20)
CO2: 26 mmol/L (ref 22–32)
Calcium: 9.5 mg/dL (ref 8.9–10.3)
Chloride: 106 mmol/L (ref 101–111)
Creatinine, Ser: 0.85 mg/dL (ref 0.44–1.00)
GFR calc Af Amer: >60 mL/min (ref >60)
GFR calc non Af Amer: >60 mL/min (ref >60)
GLUCOSE: 97 mg/dL (ref 65–99)
POTASSIUM: 3.8 mmol/L (ref 3.5–5.1)
Sodium: 139 mmol/L (ref 135–145)
TOTAL PROTEIN: 7.8 g/dL (ref 6.5–8.1)

## 2016-05-14 LAB — SALICYLATE LEVEL: Salicylate Lvl: <4 mg/dL (ref 2.8–30.0)

## 2016-05-14 LAB — CBC WITH DIFFERENTIAL/PLATELET
BASOS PCT: 0 %
Basophils Absolute: 0 10*3/uL (ref 0.0–0.1)
Eosinophils Absolute: 0 10*3/uL (ref 0.0–0.7)
Eosinophils Relative: 0 %
HEMATOCRIT: 38.5 % (ref 36.0–46.0)
Hemoglobin: 12 g/dL (ref 12.0–15.0)
Lymphocytes Relative: 44 %
Lymphs Abs: 2.3 10*3/uL (ref 0.7–4.0)
MCH: 26.8 pg (ref 26.0–34.0)
MCHC: 31.2 g/dL (ref 30.0–36.0)
MCV: 86.1 fL (ref 78.0–100.0)
MONO ABS: 0.5 10*3/uL (ref 0.1–1.0)
Monocytes Relative: 10 %
NEUTROS PCT: 46 %
Neutro Abs: 2.4 10*3/uL (ref 1.7–7.7)
Platelets: 235 10*3/uL (ref 150–400)
RBC: 4.47 MIL/uL (ref 3.87–5.11)
RDW: 13.8 % (ref 11.5–15.5)
WBC: 5.2 10*3/uL (ref 4.0–10.5)

## 2016-05-14 LAB — RAPID URINE DRUG SCREEN, HOSP PERFORMED
AMPHETAMINES: NOT DETECTED
BENZODIAZEPINES: NOT DETECTED
Barbiturates: NOT DETECTED
Cocaine: NOT DETECTED
Opiates: NOT DETECTED
TETRAHYDROCANNABINOL: NOT DETECTED

## 2016-05-14 LAB — URINE MICROSCOPIC-ADD ON

## 2016-05-14 LAB — ETHANOL: Alcohol, Ethyl (B): <5 mg/dL (ref <5)

## 2016-05-14 LAB — ACETAMINOPHEN LEVEL: Acetaminophen (Tylenol), Serum: <10 ug/mL — ABNORMAL LOW (ref 10–30)

## 2016-05-14 MED ORDER — HYDROCHLOROTHIAZIDE 25 MG PO TABS
25.0000 mg | ORAL_TABLET | Freq: Once | ORAL | Status: AC
  Administered 2016-05-14: 25 mg via ORAL
  Filled 2016-05-14: qty 1

## 2016-05-14 MED ORDER — ACETAMINOPHEN 325 MG PO TABS: 650.0000 mg | ORAL_TABLET | ORAL | Status: DC | PRN

## 2016-05-14 MED ORDER — HYDROCHLOROTHIAZIDE 25 MG PO TABS
25.0000 mg | ORAL_TABLET | Freq: Every day | ORAL | Status: DC
  Administered 2016-05-15: 25 mg via ORAL
  Filled 2016-05-14: qty 1

## 2016-05-14 MED ORDER — LISINOPRIL 10 MG PO TABS
10.0000 mg | ORAL_TABLET | Freq: Once | ORAL | Status: AC
Start: 2016-05-14 — End: 2016-05-14
  Administered 2016-05-14: 10 mg via ORAL
  Filled 2016-05-14: qty 1

## 2016-05-14 MED ORDER — HYDRALAZINE HCL 10 MG PO TABS
10.0000 mg | ORAL_TABLET | Freq: Three times a day (TID) | ORAL | Status: DC
  Administered 2016-05-14: 10 mg via ORAL
  Filled 2016-05-14 (×3): qty 1

## 2016-05-14 MED ORDER — LISINOPRIL 20 MG PO TABS
20.0000 mg | ORAL_TABLET | Freq: Every day | ORAL | Status: DC
Start: 2016-05-15 — End: 2016-05-15
  Administered 2016-05-15: 20 mg via ORAL
  Filled 2016-05-14: qty 1

## 2016-05-14 MED ORDER — ONDANSETRON HCL 4 MG PO TABS: 4.0000 mg | ORAL_TABLET | Freq: Three times a day (TID) | ORAL | Status: DC | PRN

## 2016-05-14 NOTE — ED Triage Notes (Signed)
Per pt, Pt reports having "sharp sounds" in her ears that have been keeping her up for a couple months. Denies any type of voices or hearing things that are not there. Repeatedly reports the "sharp sounds" in her ears.

## 2016-05-14 NOTE — ED Notes (Signed)
TTS cart at bedside. 
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
## 2016-05-14 NOTE — ED Notes (Signed)
Pt updated on waiting status, pt has no complaints 

## 2016-05-14 NOTE — ED Notes (Signed)
Pt ambulatory to restroom with steady gait noted; pt instructed to provide urine sample
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
## 2016-05-14 NOTE — ED Notes (Signed)
Pt wanded by security at this time  ?

## 2016-05-14 NOTE — ED Notes (Addendum)
Pt changed into wine colored scrubs; security informed of need for wanding; belonging removed from patient; folder with patients information and journal remains at bedside

## 2016-05-15 ENCOUNTER — Emergency Department (HOSPITAL_COMMUNITY): Payer: Self-pay

## 2016-05-15 MED ORDER — CEPHALEXIN 500 MG PO CAPS: 500.0000 mg | ORAL_CAPSULE | Freq: Four times a day (QID) | ORAL | 0 refills | Status: DC

## 2016-05-15 MED ORDER — CEPHALEXIN 250 MG PO CAPS
500.0000 mg | ORAL_CAPSULE | Freq: Once | ORAL | Status: AC
  Administered 2016-05-15: 500 mg via ORAL
  Filled 2016-05-15: qty 2

## 2016-05-15 NOTE — ED Notes (Signed)
Pt eating lunch. No complaints at this time.

## 2016-05-15 NOTE — ED Notes (Signed)
[place] transporter at bedside. This RN called [facility] to update that pt will be coming shortly.

## 2016-05-15 NOTE — ED Notes (Signed)
Breakfast tray ordered at 0551-TY

## 2016-05-15 NOTE — ED Notes (Addendum)
Patient was given a cup of ice water, and graham crackers. A regular diet was order for lunch.

## 2016-05-15 NOTE — ED Notes (Signed)
Pharmacy to send Hydralazine.

## 2016-05-15 NOTE — ED Provider Notes (Signed)
Pt accepted [facility] by Dr. Call for inpatient psychiatric treatment.  She is stable for transfer.  Pt also noted to have an UTI, so she was started on keflex.   Jacalyn LefevreJulie Leighanne Adolph, MD 05/15/16 [phone]

## 2016-05-15 NOTE — ED Notes (Signed)
Called report to Dortha SchwalbeMary Drake, RN at [facility]. Pt has bed available.

## 2016-05-15 NOTE — ED Notes (Signed)
Per Meghan, SW, patient needs EKG and chest xray before she can be transferred.  Verbal okay from Dr. Particia NearingHaviland to place orders.

## 2016-05-15 NOTE — ED Notes (Signed)
Breakfast tray ordered at 0548-TY

## 2016-05-15 NOTE — ED Notes (Signed)
Pt in xray

## 2016-05-15 NOTE — ED Notes (Signed)
Updated pt on plan of care [facility]). Pt expresses concern that [facility] did not hear her story correctly, that she does not want to go to [facility] again or [place] for [place]. She will not further elaborate on this. Dr. Blinda LeatherwoodPollina made aware, sts he will talk to patient soon.

## 2016-05-15 NOTE — ED Notes (Signed)
All belongings and paperwork given to [facility] transporter. Pt ready for transport.

## 2016-05-15 NOTE — Progress Notes (Signed)
Pt accepted to [facility] unit by Dr. Call, report #[phone].  Barbara, intake RN, states that pt can be admitted voluntarily if willing to consent- if pt arrives and does not consent for admission, will be [facility] per facility policy.   Ilean SkillMeghan Lewi Drost, MSW, LCSW Clinical Social Work, Disposition  05/15/2016 [phone]

## 2016-05-17 LAB — URINE CULTURE

## 2016-08-23 ENCOUNTER — Emergency Department (HOSPITAL_COMMUNITY)
Admission: EM | Admit: 2016-08-23 | Discharge: 2016-08-23 | Disposition: A | Discharge status: Home / Self Care | Payer: Self-pay | Attending: Emergency Medicine | Admitting: Emergency Medicine

## 2016-08-23 ENCOUNTER — Encounter (HOSPITAL_COMMUNITY): Payer: Self-pay | Admitting: Neurology

## 2016-08-23 DIAGNOSIS — R202 Paresthesia of skin: Secondary | ICD-10-CM | POA: Insufficient documentation

## 2016-08-23 DIAGNOSIS — I10 Essential (primary) hypertension: Secondary | ICD-10-CM | POA: Insufficient documentation

## 2016-08-23 MED ORDER — LISINOPRIL 20 MG PO TABS
20.0000 mg | ORAL_TABLET | Freq: Once | ORAL | Status: AC
  Administered 2016-08-23: 20 mg via ORAL
  Filled 2016-08-23: qty 1

## 2016-08-23 MED ORDER — HYDROCHLOROTHIAZIDE 25 MG PO TABS
25.0000 mg | ORAL_TABLET | Freq: Every day | ORAL | Status: DC
  Administered 2016-08-23: 25 mg via ORAL
  Filled 2016-08-23: qty 1

## 2016-08-23 MED ORDER — ACETAMINOPHEN 325 MG PO TABS
650.0000 mg | ORAL_TABLET | Freq: Once | ORAL | Status: AC
  Administered 2016-08-23: 650 mg via ORAL
  Filled 2016-08-23: qty 2

## 2016-08-23 MED ORDER — LISINOPRIL 20 MG PO TABS: 20.0000 mg | ORAL_TABLET | Freq: Every day | ORAL | 0 refills | Status: DC

## 2016-08-23 MED ORDER — HYDROCHLOROTHIAZIDE 12.5 MG PO TABS: 25.0000 mg | ORAL_TABLET | Freq: Every day | ORAL | 0 refills | Status: DC

## 2016-08-23 NOTE — Discharge Instructions (Signed)
It is important to follow-up with a primary care doctor for further care and treatment.  Use the attached phone number and information to help you find a doctor.  For the discomfort in your leg, take Tylenol every 4 hours.  Start taking the prescription medications for blood pressure, tomorrow.

## 2016-08-23 NOTE — ED Notes (Signed)
Pt is in stable condition upon d/c and ambulates from ED. 

## 2016-08-23 NOTE — ED Notes (Signed)
Pt ambulate to restroom from room, tolerated well.

## 2016-08-23 NOTE — ED Provider Notes (Signed)
[facility] DEPT Provider Note   CSN: [id number] Arrival date & time: 08/23/16  [id number]     History   Chief Complaint Chief Complaint  Patient presents with  . Leg Pain  . Eye Drainage    HPI Miranda Griffin is a 60 y.o. female.  She presents for evaluation of a "shock" like feeling of the right anterior thigh, present for several days, intermittently, and worse at night. She denies low back pain, hip pain, traumatic injury, fever, chills, nausea or vomiting. She has occasional eye drainage, both eyes. She [place] a shelter[place] and does not have a PCP at this time. She states that she is off her medicines for blood pressure, because she "ran out." She is also prescribed medications for an undescribed psychiatric condition, but states that she is out of some of these. She is a somewhat vague historian. She denies fever, chills, headache, cough, chest pain, weakness or dizziness. There are no other known modifying factors.  HPI  Past Medical History:  Diagnosis Date  . Anemia 2008   Now resolved, baseline- 12.5  . Heme positive stool 5/10   Colonoscopy 02/2011 showing only internal hemorrhoids  . Hyperlipidemia   . Hypertension   . Internal hemorrhoids    per colonoscopy 02/2011    Patient Active Problem List   Diagnosis Date Noted  . Paranoid schizophrenia (HCC)   . Psychoses   . Psychosis 06/27/2014  . Obesity (BMI 30.0-34.9) 11/14/2011  . Internal hemorrhoids   . Preventative health care 12/07/2010  . CONSTIPATION, INTERMITTENT 12/27/2009  . HYPERLIPIDEMIA 03/05/2007  . HYPERTENSION 09/25/2006    Past Surgical History:  Procedure Laterality Date  . CESAREAN SECTION     x2, 1989, 1990  . KNEE SURGERY      OB History    No data available       Home Medications    Prior to Admission medications   Medication Sig Start Date End Date Taking? Authorizing Provider  benztropine (COGENTIN) 1 MG tablet Take 1 tablet  (1 mg total) by mouth 2 (two) times daily. Patient not taking: Reported on 05/14/2016 07/07/14   Adonis BrookSheila Agustin, NP  cephALEXin (KEFLEX) 500 MG capsule Take 1 capsule (500 mg total) by mouth 4 (four) times daily. 05/15/16   Jacalyn LefevreJulie Haviland, MD  citalopram (CELEXA) 10 MG tablet Take 3 tablets (30 mg total) by mouth daily. Patient not taking: Reported on 05/14/2016 07/07/14   Adonis BrookSheila Agustin, NP  DM-Doxylamine-Acetaminophen (NYQUIL COLD & FLU PO) Take 30 mLs by mouth at bedtime as needed (cold).    Historical Provider, MD  hydrALAZINE (APRESOLINE) 10 MG tablet Take 1 tablet (10 mg total) by mouth every 8 (eight) hours. Patient not taking: Reported on 05/14/2016 07/07/14   Adonis BrookSheila Agustin, NP  hydrochlorothiazide (HYDRODIURIL) 12.5 MG tablet Take 2 tablets (25 mg total) by mouth daily. 08/23/16   Mancel BaleElliott Bentley Haralson, MD  hydrOXYzine (ATARAX/VISTARIL) 25 MG tablet Take 1 tablet (25 mg total) by mouth every 6 (six) hours as needed for anxiety. Patient not taking: Reported on 05/14/2016 07/07/14   Adonis BrookSheila Agustin, NP  ibuprofen (ADVIL,MOTRIN) 200 MG tablet Take 400 mg by mouth every 6 (six) hours as needed for moderate pain.     Historical Provider, MD  ibuprofen (ADVIL,MOTRIN) 800 MG tablet Take 1 tablet (800 mg total) by mouth 3 (three) times daily. Patient not taking: Reported on 05/14/2016 10/16/15   [facility], PA-C  lisinopril (PRINIVIL,ZESTRIL) 20 MG tablet Take  1 tablet (20 mg total) by mouth daily. 08/23/16   Mancel BaleElliott Vi Biddinger, MD  methocarbamol (ROBAXIN) 500 MG tablet Take 1 tablet (500 mg total) by mouth 2 (two) times daily as needed for muscle spasms. Patient not taking: Reported on 05/14/2016 10/16/15   [facility], PA-C  Multiple Vitamin (MULTIVITAMIN WITH MINERALS) TABS tablet Take 1 tablet by mouth daily. Patient not taking: Reported on 05/14/2016 07/07/14   Adonis BrookSheila Agustin, NP  pravastatin (PRAVACHOL) 40 MG tablet Take 1 tablet (40 mg total) by mouth daily at 6 PM. Patient not taking: Reported  on 05/14/2016 07/07/14   Adonis BrookSheila Agustin, NP  risperiDONE (RISPERDAL) 3 MG tablet Take 1 tablet (3 mg total) by mouth every morning. Patient not taking: Reported on 05/14/2016 07/07/14   Adonis BrookSheila Agustin, NP  risperiDONE (RISPERDAL) 4 MG tablet Take 1 tablet (4 mg total) by mouth at bedtime. Patient not taking: Reported on 05/14/2016 07/07/14   Adonis BrookSheila Agustin, NP  traZODone (DESYREL) 100 MG tablet Take 1 tablet (100 mg total) by mouth at bedtime and may repeat dose one time if needed. Patient not taking: Reported on 05/14/2016 07/07/14   Adonis BrookSheila Agustin, NP  vitamin B-12 (CYANOCOBALAMIN) 100 MCG tablet Take 1 tablet (100 mcg total) by mouth daily. Patient not taking: Reported on 05/14/2016 07/07/14   Adonis BrookSheila Agustin, NP    Family History Family History  Problem Relation Age of Onset  . Hypertension Father   . Vision loss Father   . Vision loss Paternal Grandmother   . Hypertension Other   . Hyperlipidemia Other     Social History Social History  Substance Use Topics  . Smoking status: Never Smoker  . Smokeless tobacco: Never Used  . Alcohol use No     Comment: occasional wine     Allergies   Patient has no known allergies.   Review of Systems Review of Systems  All other systems reviewed and are negative.    Physical Exam Updated Vital Signs BP 176/95   Pulse (!) 55   Temp 97.8 F (36.6 C) (Oral)   Resp 11   Ht 5\' 2"  (1.575 m)   Wt 160 lb (72.6 kg)   LMP 12/05/2009   SpO2 99%   BMI 29.26 kg/m   Physical Exam  Constitutional: She is oriented to person, place, and time. She appears well-developed and well-nourished. No distress.  HENT:  Head: Normocephalic and atraumatic.  Eyes: Conjunctivae and EOM are normal. Pupils are equal, round, and reactive to light.  No appreciable drainage from either eye. No redness of the conjunctiva.  Neck: Normal range of motion and phonation normal. Neck supple.  Cardiovascular: Normal rate and regular rhythm.   Pulmonary/Chest:  Effort normal and breath sounds normal. She exhibits no tenderness.  Abdominal: Soft. She exhibits no distension. There is no tenderness. There is no guarding.  Musculoskeletal: Normal range of motion.  Normal range of motion back. No tenderness of the thoracic or lumbar spine.  Neurological: She is alert and oriented to person, place, and time. She exhibits normal muscle tone.  No dysarthria and aphasia or nystagmus.  Skin: Skin is warm and dry.  Psychiatric: She has a normal mood and affect. Her behavior is normal. Judgment and thought content normal.  Nursing note and vitals reviewed.    ED Treatments / Results  Labs (all labs ordered are listed, but only abnormal results are displayed) Labs Reviewed - No data to display  EKG  EKG Interpretation None  Radiology No results found.  Procedures Procedures (including critical care time)  Medications Ordered in ED Medications  hydrochlorothiazide (HYDRODIURIL) tablet 25 mg (25 mg Oral Given 08/23/16 1042)  lisinopril (PRINIVIL,ZESTRIL) tablet 20 mg (20 mg Oral Given 08/23/16 1042)  acetaminophen (TYLENOL) tablet 650 mg (650 mg Oral Given 08/23/16 1042)     Initial Impression / Assessment and Plan / ED Course  I have reviewed the triage vital signs and the nursing notes.  Pertinent labs & imaging results that were available during my care of the patient were reviewed by me and considered in my medical decision making (see chart for details).  Clinical Course     Medications  hydrochlorothiazide (HYDRODIURIL) tablet 25 mg (25 mg Oral Given 08/23/16 1042)  lisinopril (PRINIVIL,ZESTRIL) tablet 20 mg (20 mg Oral Given 08/23/16 1042)  acetaminophen (TYLENOL) tablet 650 mg (650 mg Oral Given 08/23/16 1042)    Patient Vitals for the past 24 hrs:  BP Temp Temp src Pulse Resp SpO2 Height Weight  08/23/16 1200 176/95 - - (!) 55 11 99 % - -  08/23/16 1145 190/92 - - (!) 56 13 100 % - -  08/23/16 1115 196/95 - - (!) 58 14  100 % - -  08/23/16 1045 200/95 - - 66 14 100 % - -  08/23/16 1030 181/96 - - 63 15 99 % - -  08/23/16 1020 (!) 200/131 - - 63 14 99 % - -  08/23/16 1005 - - - - - - 5\' 2"  (1.575 m) 160 lb (72.6 kg)  08/23/16 1004 (!) 220/102 97.8 F (36.6 C) Oral 69 16 100 % - -    12:36 PM Reevaluation with update and discussion. After initial assessment and treatment, an updated evaluation reveals BP improved. No other change in clinical status. Ry Moody L    Final Clinical Impressions(s) / ED Diagnoses   Final diagnoses:  Hypertension, unspecified type  Paresthesia    New Prescriptions New Prescriptions   HYDROCHLOROTHIAZIDE (HYDRODIURIL) 12.5 MG TABLET    Take 2 tablets (25 mg total) by mouth daily.   LISINOPRIL (PRINIVIL,ZESTRIL) 20 MG TABLET    Take 1 tablet (20 mg total) by mouth daily.     Mancel Bale, MD 08/23/16 [phone]

## 2016-08-23 NOTE — ED Notes (Signed)
Pt states she has a feeling of a shock in her right thigh that is intermittent.

## 2016-08-23 NOTE — ED Triage Notes (Addendum)
Pt here with reports of intermittent sharp pain to right leg, is not having pain now. Is having excessive eye drainage and tears that she can't control. Reports some blurry vision but does have bad eyes to start. These sx have been going on for several months.  Pt is ambulatory at current. Has HTN but has been out of her BP meds, b/c the med ran out and she doesn't have PCP due to being in "transition".

## 2016-09-30 ENCOUNTER — Encounter (HOSPITAL_COMMUNITY): Payer: Self-pay | Admitting: Emergency Medicine

## 2016-09-30 ENCOUNTER — Emergency Department (HOSPITAL_COMMUNITY)
Admission: EM | Admit: 2016-09-30 | Discharge: 2016-09-30 | Disposition: A | Discharge status: Home / Self Care | Payer: Self-pay | Attending: Physician Assistant | Admitting: Physician Assistant

## 2016-09-30 ENCOUNTER — Emergency Department (HOSPITAL_COMMUNITY): Payer: Self-pay

## 2016-09-30 DIAGNOSIS — I10 Essential (primary) hypertension: Secondary | ICD-10-CM | POA: Insufficient documentation

## 2016-09-30 DIAGNOSIS — M25552 Pain in left hip: Secondary | ICD-10-CM | POA: Insufficient documentation

## 2016-09-30 DIAGNOSIS — M545 Low back pain, unspecified: Secondary | ICD-10-CM

## 2016-09-30 DIAGNOSIS — Z79899 Other long term (current) drug therapy: Secondary | ICD-10-CM | POA: Insufficient documentation

## 2016-09-30 MED ORDER — CYCLOBENZAPRINE HCL 10 MG PO TABS
10.0000 mg | ORAL_TABLET | Freq: Once | ORAL | Status: AC
  Administered 2016-09-30: 10 mg via ORAL
  Filled 2016-09-30: qty 1

## 2016-09-30 MED ORDER — IBUPROFEN 200 MG PO TABS
600.0000 mg | ORAL_TABLET | Freq: Once | ORAL | Status: AC
  Administered 2016-09-30: 600 mg via ORAL
  Filled 2016-09-30: qty 1

## 2016-09-30 MED ORDER — CYCLOBENZAPRINE HCL 10 MG PO TABS: 10.0000 mg | ORAL_TABLET | Freq: Two times a day (BID) | ORAL | 0 refills | Status: DC | PRN

## 2016-09-30 NOTE — ED Notes (Signed)
Pt. Bent over and felt a pop near her lt. Lower back/ hips/buttocks area, Pt. Denies any dysuria or hematuria.    Pt. Had lower back pain prior to the feeling of a pop in her lt. Lateral hip area.;  Denies any injuries

## 2016-09-30 NOTE — ED Triage Notes (Signed)
Left hip pain that rads to her back x 2 weeks  And it hurts her left leg denies injury

## 2016-09-30 NOTE — Discharge Instructions (Signed)
Take Ibuprofen (Advil) or Naproxen (Aleve) for muscle pain. [facility]

## 2016-09-30 NOTE — ED Provider Notes (Signed)
[facility]PT Provider Note   CSN: [id number] Arrival date & time: 09/30/16  1252  By signing my name below, I, Sonum Patel, attest that this documentation has been prepared under the direction and in the presence of Wells FargoKelly Dreya Buhrman, PA-C. Electronically Signed: Sonum Patel, Neurosurgeoncribe. 09/30/16. 3:02 PM.  History   Chief Complaint Chief Complaint  Patient presents with  . Hip Pain  . Back Pain    The history is provided by the patient. No language interpreter was used.     HPI Comments: Miranda Griffin is a 61 y.o. female who presents to the Emergency Department complaining of intermittent left sided back and hip pain for the past 1 month. Of note she was seen in the ED for a "shock" down her right thigh on 12/29 which has resolved and she states that is unrelated to her pain today. The pain is intermittent and several days ago she reports bending down and feeling a popping sensation which worsened the pain. She reports radiation to the bilateral buttocks. Walking makes it worse. Nothing has made it better. She denies fever, numbness, paresthesia, weakness, dysuria, nausea, vomiting, difficulty ambulating, bowel/bladder incontinence, trauma.   Past Medical History:  Diagnosis Date  . Anemia 2008   Now resolved, baseline- 12.5  . Heme positive stool 5/10   Colonoscopy 02/2011 showing only internal hemorrhoids  . Hyperlipidemia   . Hypertension   . Internal hemorrhoids    per colonoscopy 02/2011    Patient Active Problem List   Diagnosis Date Noted  . Paranoid schizophrenia (HCC)   . Psychoses   . Psychosis 06/27/2014  . Obesity (BMI 30.0-34.9) 11/14/2011  . Internal hemorrhoids   . Preventative health care 12/07/2010  . CONSTIPATION, INTERMITTENT 12/27/2009  . HYPERLIPIDEMIA 03/05/2007  . HYPERTENSION 09/25/2006    Past Surgical History:  Procedure Laterality Date  . CESAREAN SECTION     x2, 1989, 1990  . KNEE SURGERY      OB History    No data available        Home Medications    Prior to Admission medications   Medication Sig Start Date End Date Taking? Authorizing Provider  benztropine (COGENTIN) 1 MG tablet Take 1 tablet (1 mg total) by mouth 2 (two) times daily. Patient not taking: Reported on 05/14/2016 07/07/14   Adonis BrookSheila Agustin, NP  cephALEXin (KEFLEX) 500 MG capsule Take 1 capsule (500 mg total) by mouth 4 (four) times daily. 05/15/16   Jacalyn LefevreJulie Haviland, MD  citalopram (CELEXA) 10 MG tablet Take 3 tablets (30 mg total) by mouth daily. Patient not taking: Reported on 05/14/2016 07/07/14   Adonis BrookSheila Agustin, NP  DM-Doxylamine-Acetaminophen (NYQUIL COLD & FLU PO) Take 30 mLs by mouth at bedtime as needed (cold).    Historical Provider, MD  hydrALAZINE (APRESOLINE) 10 MG tablet Take 1 tablet (10 mg total) by mouth every 8 (eight) hours. Patient not taking: Reported on 05/14/2016 07/07/14   Adonis BrookSheila Agustin, NP  hydrochlorothiazide (HYDRODIURIL) 12.5 MG tablet Take 2 tablets (25 mg total) by mouth daily. 08/23/16   Mancel BaleElliott Wentz, MD  hydrOXYzine (ATARAX/VISTARIL) 25 MG tablet Take 1 tablet (25 mg total) by mouth every 6 (six) hours as needed for anxiety. Patient not taking: Reported on 05/14/2016 07/07/14   Adonis BrookSheila Agustin, NP  ibuprofen (ADVIL,MOTRIN) 200 MG tablet Take 400 mg by mouth every 6 (six) hours as needed for moderate pain.     Historical Provider, MD  ibuprofen (ADVIL,MOTRIN) 800 MG tablet Take 1 tablet (800 mg total) by mouth  3 (three) times daily. Patient not taking: Reported on 05/14/2016 10/16/15   [facility] Ward, PA-C  lisinopril (PRINIVIL,ZESTRIL) 20 MG tablet Take 1 tablet (20 mg total) by mouth daily. 08/23/16   Mancel Bale, MD  methocarbamol (ROBAXIN) 500 MG tablet Take 1 tablet (500 mg total) by mouth 2 (two) times daily as needed for muscle spasms. Patient not taking: Reported on 05/14/2016 10/16/15   [facility] Ward, PA-C  Multiple Vitamin (MULTIVITAMIN WITH MINERALS) TABS tablet Take 1 tablet by mouth daily. Patient not  taking: Reported on 05/14/2016 07/07/14   Adonis Brook, NP  pravastatin (PRAVACHOL) 40 MG tablet Take 1 tablet (40 mg total) by mouth daily at 6 PM. Patient not taking: Reported on 05/14/2016 07/07/14   Adonis Brook, NP  risperiDONE (RISPERDAL) 3 MG tablet Take 1 tablet (3 mg total) by mouth every morning. Patient not taking: Reported on 05/14/2016 07/07/14   Adonis Brook, NP  risperiDONE (RISPERDAL) 4 MG tablet Take 1 tablet (4 mg total) by mouth at bedtime. Patient not taking: Reported on 05/14/2016 07/07/14   Adonis Brook, NP  traZODone (DESYREL) 100 MG tablet Take 1 tablet (100 mg total) by mouth at bedtime and may repeat dose one time if needed. Patient not taking: Reported on 05/14/2016 07/07/14   Adonis Brook, NP  vitamin B-12 (CYANOCOBALAMIN) 100 MCG tablet Take 1 tablet (100 mcg total) by mouth daily. Patient not taking: Reported on 05/14/2016 07/07/14   Adonis Brook, NP    Family History Family History  Problem Relation Age of Onset  . Hypertension Father   . Vision loss Father   . Vision loss Paternal Grandmother   . Hypertension Other   . Hyperlipidemia Other     Social History Social History  Substance Use Topics  . Smoking status: Never Smoker  . Smokeless tobacco: Never Used  . Alcohol use No     Comment: occasional wine     Allergies   Patient has no known allergies.   Review of Systems Review of Systems  Musculoskeletal: Positive for arthralgias and back pain.  Neurological: Negative for weakness and numbness.     Physical Exam Updated Vital Signs BP (!) 182/115 (BP Location: Left Arm)   Pulse 82   Temp 98.2 F (36.8 C) (Oral)   Resp 18   Ht 5\' 2"  (1.575 m)   Wt 162 lb (73.5 kg)   LMP 12/05/2009   SpO2 100%   BMI 29.63 kg/m   Physical Exam  Constitutional: She is oriented to person, place, and time. She appears well-developed and well-nourished.  HENT:  Head: Normocephalic and atraumatic.  Cardiovascular: Normal rate.    Pulmonary/Chest: Effort normal.  Musculoskeletal: She exhibits tenderness.  Inspection: No masses, deformity, or rash Palpation: No midline spinal tenderness.  Tenderness over right lumbar paraspinal muscle going into the right buttocks.  Strength: 5/5 in lower extremities and normal plantar and dorsiflexion Sensation: Intact sensation with light touch in lower extremities bilaterally Reflexes: Patellar reflex is 2+ bilaterally SLR: Negative seated straight leg raise Gait: Normal gait   Neurological: She is alert and oriented to person, place, and time.  Skin: Skin is warm and dry.  Psychiatric: She has a normal mood and affect.  Nursing note and vitals reviewed.    ED Treatments / Results  DIAGNOSTIC STUDIES: Oxygen Saturation is 100% on RA, normal by my interpretation.    COORDINATION OF CARE: 3:11 PM Discussed treatment plan with pt at bedside and pt agreed to plan.   Labs (  all labs ordered are listed, but only abnormal results are displayed) Labs Reviewed - No data to display  EKG  EKG Interpretation None       Radiology No results found.  Procedures Procedures (including critical care time)  Medications Ordered in ED Medications - No data to display   Initial Impression / Assessment and Plan / ED Course  I have reviewed the triage vital signs and the nursing notes.  Pertinent labs & imaging results that were available during my care of the patient were reviewed by me and considered in my medical decision making (see chart for details).  Patient with back pain.  No neurological deficits and normal neuro exam.  Patient is ambulatory.  No loss of bowel or bladder control.  No concern for cauda equina.  No fever, night sweats, weight loss, h/o cancer, IVDA, no recent procedure to back. No urinary symptoms suggestive of UTI.  Patient is insistent on Xray even though it is not indicated. Xray of lumbar spine is remarkable for mild arthritis. Supportive care and  return precaution discussed. Appears safe for discharge at this time. Follow up as indicated in discharge paperwork.    Pt also noted to be markedly hypertensive. Urged her to make appt with PCP. She states she will follow up with [facility].  Final Clinical Impressions(s) / ED Diagnoses   Final diagnoses:  Left-sided low back pain without sciatica, unspecified chronicity  Left hip pain    New Prescriptions New Prescriptions   No medications on file   I personally performed the services described in this documentation, which was scribed in my presence. The recorded information has been reviewed and is accurate.    Bethel Born, PA-C 10/01/16 1852    Courteney Randall An, MD 10/03/16 1191

## 2016-09-30 NOTE — ED Notes (Signed)
Patient Alert and oriented X4. Stable and ambulatory. Patient verbalized understanding of the discharge instructions.  Patient belongings were taken by the patient.  

## 2016-09-30 NOTE — ED Notes (Signed)
Retook BP with patient's arm relaxed

## 2016-09-30 NOTE — ED Notes (Signed)
Patient transported to XRAY 

## 2016-10-28 ENCOUNTER — Ambulatory Visit (INDEPENDENT_AMBULATORY_CARE_PROVIDER_SITE_OTHER): Payer: Self-pay | Admitting: Internal Medicine

## 2016-10-28 ENCOUNTER — Encounter: Payer: Self-pay | Admitting: Internal Medicine

## 2016-10-28 VITALS — BP 201/104 | HR 70 | Temp 97.8°F | Wt 163.3 lb

## 2016-10-28 DIAGNOSIS — Z0000 Encounter for general adult medical examination without abnormal findings: Secondary | ICD-10-CM

## 2016-10-28 DIAGNOSIS — Z79899 Other long term (current) drug therapy: Secondary | ICD-10-CM

## 2016-10-28 DIAGNOSIS — I10 Essential (primary) hypertension: Secondary | ICD-10-CM

## 2016-10-28 DIAGNOSIS — Z0189 Encounter for other specified special examinations: Secondary | ICD-10-CM

## 2016-10-28 MED ORDER — LISINOPRIL 20 MG PO TABS: 20.0000 mg | ORAL_TABLET | Freq: Every day | ORAL | 0 refills | Status: DC

## 2016-10-28 MED ORDER — HYDROCHLOROTHIAZIDE 25 MG PO TABS: 25.0000 mg | ORAL_TABLET | Freq: Every day | ORAL | 2 refills | Status: DC

## 2016-10-28 NOTE — Patient Instructions (Addendum)
I have sent your lisinopril and hydrochlorothiazide to [place] [facility].   They will be $4 each for 3 month supply.   Keep your appointment with [facility] to get orange card tomorrow.  If you start having chest pain, shortness of breath, headaches, please return to the clinic or go to the ED.

## 2016-10-28 NOTE — Progress Notes (Signed)
   CC: Establishing care  HPI:  [name] is a 61 y.o. with a PMH of hypertension, hyperlipidemia, and paranoid schizophrenia presenting to clinic to establish care and address her hypertension. Next  Patient has been off her medicine for blood pressure due to running out and not being establish with a primary care provider. Patient was seen at the end of December the ED was found to have blood pressure up to 220s over 100. Patient was represcribed her HCTZ 25 mg daily lisinopril 20 mg daily with refills. Patient states that she had been taking these but ran out about a week ago. She denies chest pain, shortness of breath, headache, acute vision change, dizziness, focal weakness.  Please see problem based Assessment and Plan for status of patients chronic conditions.  Past Medical History:  Diagnosis Date  . Anemia 2008   Now resolved, baseline- 12.5  . Heme positive stool 5/10   Colonoscopy 02/2011 showing only internal hemorrhoids  . Hyperlipidemia   . Hypertension   . Internal hemorrhoids    per colonoscopy 02/2011    Review of Systems:   Review of Systems  Constitutional: Negative for chills, fever and weight loss.  HENT: Negative for hearing loss and tinnitus.   Eyes: Positive for blurred vision (chronic) and discharge (chronic; she states that at times she just has free-flowing tears). Negative for double vision, photophobia and pain.  Respiratory: Negative for cough and shortness of breath.   Cardiovascular: Negative for chest pain, palpitations and leg swelling.  Gastrointestinal: Negative for abdominal pain, blood in stool, constipation, diarrhea, melena, nausea and vomiting.  Neurological: Negative for dizziness, tingling, sensory change, speech change, focal weakness, loss of consciousness and headaches.  Psychiatric/Behavioral: Negative for depression and substance abuse.    Physical Exam:  Vitals:   10/28/16 1353 10/28/16 1516  BP: (!) 180/99 (!) 198/97    Pulse: 70 69  Temp: 97.8 F (36.6 C)   SpO2: 100%   Weight: 163 lb 4.8 oz (74.1 kg)    Physical Exam  Constitutional: She is oriented to person, place, and time. She appears well-developed and well-nourished. No distress.  HENT:  Head: Normocephalic and atraumatic.  Eyes: Conjunctivae and EOM are normal. Pupils are equal, round, and reactive to light. Right eye exhibits no discharge. Left eye exhibits no discharge.  Neck: Normal range of motion. Neck supple.  Cardiovascular: Normal rate, regular rhythm, normal heart sounds and intact distal pulses.  Exam reveals no gallop and no friction rub.   No murmur heard. Pulmonary/Chest: Effort normal and breath sounds normal. No respiratory distress. She has no wheezes. She has no rales.  Abdominal: Soft. Bowel sounds are normal. She exhibits no distension and no mass. There is no tenderness. There is no guarding.  Musculoskeletal: Normal range of motion. She exhibits no edema.  Neurological: She is alert and oriented to person, place, and time. She displays normal reflexes. No cranial nerve deficit or sensory deficit. Coordination normal.  Skin: Skin is warm and dry. Capillary refill takes less than 2 seconds. She is not diaphoretic.    Assessment & Plan:   See Encounters Tab for problem based charting.   Patient discussed with Dr. Cherene AltesNarendra   Karina Nofsinger, MD Internal Medicine PGY1

## 2016-10-29 ENCOUNTER — Telehealth: Payer: Self-pay | Admitting: Internal Medicine

## 2016-10-29 ENCOUNTER — Ambulatory Visit: Payer: Self-pay

## 2016-10-29 NOTE — Telephone Encounter (Signed)
[facility] calls and states pt is having some serious issues that she needs to get help for and she has talked to pt about signing a release to talk to the clinic, she is very concerned about pt and states if she does not get help she will need to leave the program. Pt is on her way to see debh. For assistand is supposed to speak to the nurse when she comes into clinic. Will offer pt a chance to communicate what her problem is.

## 2016-10-29 NOTE — Assessment & Plan Note (Signed)
Patient is in process of obtaining orange card. She has very limited means otherwise.  Once she has obtained orange card and/or has a more stable financial situation: --pap smear - will let patient know about our free pap clinic date coming up --mammogram - can go through scholarship program --Hep C screening --flu shot

## 2016-10-29 NOTE — Telephone Encounter (Signed)
Calling with concerns about pt please call

## 2016-10-29 NOTE — Assessment & Plan Note (Addendum)
Patient has been off her medicine for blood pressure due to running out and not being establish with a primary care provider. Patient was seen at the end of December the ED was found to have blood pressure up to 220s over 100. Patient was represcribed her HCTZ 25 mg daily lisinopril 20 mg daily with refills. Patient states that she had been taking these but ran out about a week ago and has not had money for refills.  Today clinic she is hypertensive to 200/100. She denies chest pain, shortness of breath, headache, acute vision change, dizziness, focal weakness. Exam is unremarkable. Unfortunately clinic does not stock sample antihypertensives. Patient states that she can ask her children for help in affording her medicines. I stressed the importance of controlling her blood pressure to decrease risk of stroke and heart attack. At this time patient is stable to go home closely been monitoring her blood pressure in the clinic  Plan -Refill HCTZ 20 mg daily and lisinopril 20 mg daily; she should be able to get 90 day supply for $4 each -Follow-up in clinic in 2 days for blood pressure check -We discussed that if she were to get focal weakness, worsening headaches, chest pain, shortness of breath that she should go immediately to the emergency department.

## 2016-10-30 ENCOUNTER — Encounter: Payer: Self-pay | Admitting: Internal Medicine

## 2016-10-30 ENCOUNTER — Encounter (HOSPITAL_COMMUNITY): Payer: Self-pay | Admitting: Emergency Medicine

## 2016-10-30 ENCOUNTER — Emergency Department (HOSPITAL_COMMUNITY)
Admission: EM | Admit: 2016-10-30 | Discharge: 2016-10-31 | Disposition: A | Discharge status: Other Acute Inpatient Hospital | Payer: Medicaid Other | Attending: Emergency Medicine | Admitting: Emergency Medicine

## 2016-10-30 ENCOUNTER — Ambulatory Visit (INDEPENDENT_AMBULATORY_CARE_PROVIDER_SITE_OTHER): Payer: Self-pay | Admitting: Internal Medicine

## 2016-10-30 VITALS — BP 178/89 | HR 69 | Temp 98.0°F | Ht 62.0 in | Wt 164.1 lb

## 2016-10-30 DIAGNOSIS — I10 Essential (primary) hypertension: Secondary | ICD-10-CM | POA: Insufficient documentation

## 2016-10-30 DIAGNOSIS — F200 Paranoid schizophrenia: Secondary | ICD-10-CM | POA: Insufficient documentation

## 2016-10-30 DIAGNOSIS — Z79899 Other long term (current) drug therapy: Secondary | ICD-10-CM

## 2016-10-30 LAB — COMPREHENSIVE METABOLIC PANEL
ALK PHOS: 96 U/L (ref 38–126)
ALT: 18 U/L (ref 14–54)
ANION GAP: 11 (ref 5–15)
AST: 24 U/L (ref 15–41)
Albumin: 4.2 g/dL (ref 3.5–5.0)
BUN: 13 mg/dL (ref 6–20)
CALCIUM: 9.8 mg/dL (ref 8.9–10.3)
CHLORIDE: 102 mmol/L (ref 101–111)
CO2: 26 mmol/L (ref 22–32)
CREATININE: 0.89 mg/dL (ref 0.44–1.00)
Glucose, Bld: 88 mg/dL (ref 65–99)
Potassium: 3.4 mmol/L — ABNORMAL LOW (ref 3.5–5.1)
SODIUM: 139 mmol/L (ref 135–145)
Total Bilirubin: 0.6 mg/dL (ref 0.3–1.2)
Total Protein: 8 g/dL (ref 6.5–8.1)

## 2016-10-30 LAB — CBC
HCT: 38.3 % (ref 36.0–46.0)
HEMOGLOBIN: 12.4 g/dL (ref 12.0–15.0)
MCH: 27.4 pg (ref 26.0–34.0)
MCHC: 32.4 g/dL (ref 30.0–36.0)
MCV: 84.7 fL (ref 78.0–100.0)
Platelets: 230 10*3/uL (ref 150–400)
RBC: 4.52 MIL/uL (ref 3.87–5.11)
RDW: 13.8 % (ref 11.5–15.5)
WBC: 5.5 10*3/uL (ref 4.0–10.5)

## 2016-10-30 LAB — ACETAMINOPHEN LEVEL

## 2016-10-30 LAB — ETHANOL: Alcohol, Ethyl (B): <5 mg/dL (ref <5)

## 2016-10-30 LAB — SALICYLATE LEVEL

## 2016-10-30 MED ORDER — ONDANSETRON HCL 4 MG PO TABS: 4.0000 mg | ORAL_TABLET | Freq: Three times a day (TID) | ORAL | Status: DC | PRN

## 2016-10-30 MED ORDER — HYDROCHLOROTHIAZIDE 25 MG PO TABS
25.0000 mg | ORAL_TABLET | Freq: Every day | ORAL | Status: DC
  Administered 2016-10-30 – 2016-10-31 (×2): 25 mg via ORAL
  Filled 2016-10-30 (×2): qty 1

## 2016-10-30 MED ORDER — ALUM & MAG HYDROXIDE-SIMETH 200-200-20 MG/5ML PO SUSP
30.0000 mL | ORAL | Status: DC | PRN
Start: 2016-10-30 — End: 2016-10-31

## 2016-10-30 MED ORDER — ACETAMINOPHEN 325 MG PO TABS: 650.0000 mg | ORAL_TABLET | ORAL | Status: DC | PRN

## 2016-10-30 MED ORDER — LORAZEPAM 1 MG PO TABS: 1.0000 mg | ORAL_TABLET | Freq: Three times a day (TID) | ORAL | Status: DC | PRN

## 2016-10-30 MED ORDER — IBUPROFEN 400 MG PO TABS: 600.0000 mg | ORAL_TABLET | Freq: Three times a day (TID) | ORAL | Status: DC | PRN

## 2016-10-30 MED ORDER — LISINOPRIL 20 MG PO TABS
20.0000 mg | ORAL_TABLET | Freq: Every day | ORAL | Status: DC
  Administered 2016-10-30 – 2016-10-31 (×2): 20 mg via ORAL
  Filled 2016-10-30 (×2): qty 1
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
## 2016-10-30 NOTE — Progress Notes (Signed)
CC: hypertension  HPI:  [name] is a 61 y.o. with a PMH of HTN, paranoid schizophrenia presenting to clinic for follow up on her hypertension.   Patient was able to take HCTZ but was not able to fill lisinopril due to low funds. Her BP today is stable in 170s/80s down from 200/100 two days prior. She denies chest pain, shortness of breath, focal weakness, headaches. She has chronic blurry vision which is not worsened but does note mild intermittent "pinching" of her eyes. She has been uncontrolled for several weeks and this is an adequate decrease for now.  We discussed her previous [facility] hospitalizations. Patient noted that she was hospitalized to help her get decontaminated; she believed that she has been given medicine of some sort that allows the government to track her. She notes that she has been under government investigation for more than a year now; she doesn't know why. She states that the former president, several senators and the governor are also under investigation. She states that she has had people follow her and has moved several times due to this. She endorses hearing satellite "beeping" noise everywhere and currently in the room with me. She denies hearing voices or is not experiencing visual hallucinations. She denies suicidal or homicidal ideation. She states that while she was in the hospital she did feel a little bit better with the medicines; after release she followed with [facility] who she states changed her medicines, and currently she is not on anything other than her HCTZ.  Patient is currently living at [facility]; our clinic received a call from the program director there stating she was very concerned about the patient but we do not have further information.   Patient agrees to have a second opinion on her "contamination" and is willing to go to the ED for evaluation. Patient is actively psychotic and I think would greatly benefit from  inpatient psych management of her psychosis. She has limited means and would be very difficult to manage solely outpatient at this time.  Please see problem based Assessment and Plan for status of patients chronic conditions.  Past Medical History:  Diagnosis Date  . Anemia 2008   Now resolved, baseline- 12.5  . Heme positive stool 5/10   Colonoscopy 02/2011 showing only internal hemorrhoids  . Hyperlipidemia   . Hypertension   . Internal hemorrhoids    per colonoscopy 02/2011    Review of Systems:   Review of Systems  Constitutional: Negative for chills, fever and weight loss.  HENT: Negative for hearing loss and tinnitus.   Eyes: Positive for discharge. Negative for blurred vision, double vision and photophobia.  Respiratory: Negative for cough.   Cardiovascular: Negative for chest pain, palpitations and leg swelling.  Gastrointestinal: Negative for abdominal pain, constipation, diarrhea, nausea and vomiting.  Genitourinary: Negative for flank pain.  Musculoskeletal: Negative for myalgias.  Skin: Negative for rash.  Neurological: Negative for dizziness, sensory change, speech change, focal weakness, loss of consciousness and headaches.  Psychiatric/Behavioral: Positive for hallucinations (auditory). Negative for depression and suicidal ideas. The patient is not nervous/anxious and does not have insomnia.     Physical Exam:  Vitals:   10/30/16 1320  BP: (!) 176/104  Pulse: 72  Temp: 98 F (36.7 C)  TempSrc: Oral  SpO2: 100%  Weight: 164 lb 1.6 oz (74.4 kg)  Height: 5\' 2"  (1.575 m)   Physical Exam  Constitutional: She is oriented to person, place, and time. She  appears well-developed and well-nourished. No distress.  HENT:  Head: Normocephalic and atraumatic.  Eyes: Conjunctivae and EOM are normal. Right eye exhibits no discharge. No scleral icterus.  Neck: Normal range of motion.  Cardiovascular: Normal rate, regular rhythm, normal heart sounds and intact distal  pulses.   Pulmonary/Chest: Effort normal. She has no wheezes. She has no rales.  Abdominal: Soft. Bowel sounds are normal. She exhibits no distension. There is no tenderness. There is no guarding.  Musculoskeletal: Normal range of motion. She exhibits no edema.  Neurological: She is alert and oriented to person, place, and time. No cranial nerve deficit.  Skin: Skin is warm and dry. Capillary refill takes less than 2 seconds. She is not diaphoretic.  Psychiatric:  Patient endorses auditory hallucinations of hearing satellite beeping from the government tracking her through an internal device she has. This has worried her in the past but now she tends to accept it. She denies suicidal/homicidal ideation. She denies depressive or anxiety symptoms.     Assessment & Plan:   See Encounters Tab for problem based charting.   Patient discussed with Dr. Valla LeaverButcher   Vere Diantonio, MD Internal Medicine PGY1

## 2016-10-30 NOTE — ED Provider Notes (Signed)
[facility]-EM[facility] DEPT Provider Note   CSN: [id number] Arrival date & time: 10/30/16  1425     History   Chief Complaint Chief Complaint  Patient presents with  . Hallucinations    HPI Miranda Griffin is a 61 y.o. female.  Pt presents to the ED today with auditory and visual hallucinations.  The [facility]  The program director called the IM clinic and expressed concerns for her psychosis.  The pt does have a hx of paranoid schizophrenia and has had prior hospitalizations.  At her last hospitalization in September, she was placed on Zyprexa and sx improved.  She was not discharged on it, and all she takes now is HCTZ.  She is supposed to also be on lisinopril, but is unable to afford it.  The pt had surgery on her ankle a few years ago and believes she was implanted with a tracking device.  According to her doctor's notes, she:  "Patient noted that she was hospitalized to help her get decontaminated; she believed that she has been given medicine of some sort that allows the government to track her. She notes that she has been under government investigation for more than a year now; she doesn't know why. She states that the former president, several senators and the governor are also under investigation. She states that she has had people follow her and has moved several times due to this. She endorses hearing satellite "beeping" noise everywhere and currently in the room with me. She denies hearing voices or is not experiencing visual hallucinations. She denies suicidal or homicidal ideation. She states that while she was in the hospital she did feel a little bit better with the medicines; after release she followed with [facility] who she states changed her medicines, and currently she is not on anything other than her HCTZ.  Patient is currently living at [facility]; our clinic received a call from the program director there stating she was  very concerned about the patient but we do not have further information.   Patient agrees to have a second opinion on her "contamination" and is willing to go to the ED for evaluation. Patient is actively psychotic and I think would greatly benefit from inpatient psych management of her psychosis. She has limited means and would be very difficult to manage solely outpatient at this time."  Due to all of the above, her doctor sent her here for MH eval.      Past Medical History:  Diagnosis Date  . Anemia 2008   Now resolved, baseline- 12.5  . Heme positive stool 5/10   Colonoscopy 02/2011 showing only internal hemorrhoids  . Hyperlipidemia   . Hypertension   . Internal hemorrhoids    per colonoscopy 02/2011    Patient Active Problem List   Diagnosis Date Noted  . Paranoid schizophrenia (HCC)   . Psychoses   . Obesity (BMI 30.0-34.9) 11/14/2011  . Internal hemorrhoids   . Preventative health care 12/07/2010  . CONSTIPATION, INTERMITTENT 12/27/2009  . HYPERLIPIDEMIA 03/05/2007  . Essential hypertension 09/25/2006    Past Surgical History:  Procedure Laterality Date  . CESAREAN SECTION     x2, 1989, 1990  . KNEE SURGERY      OB History    No data available       Home Medications    Prior to Admission medications   Medication Sig Start Date End Date Taking? Authorizing Provider  hydrochlorothiazide (  HYDRODIURIL) 25 MG tablet Take 1 tablet (25 mg total) by mouth daily. 10/28/16   Nyra MarketGorica Svalina, MD  ibuprofen (ADVIL,MOTRIN) 200 MG tablet Take 400 mg by mouth every 6 (six) hours as needed for moderate pain.     Historical Provider, MD  lisinopril (PRINIVIL,ZESTRIL) 20 MG tablet Take 1 tablet (20 mg total) by mouth daily. 10/28/16   Nyra MarketGorica Svalina, MD    Family History Family History  Problem Relation Age of Onset  . Hypertension Father   . Vision loss Father   . Vision loss Paternal Grandmother   . Hypertension Other   . Hyperlipidemia Other     Social  History Social History  Substance Use Topics  . Smoking status: Never Smoker  . Smokeless tobacco: Never Used  . Alcohol use No     Comment: occasional wine     Allergies   Patient has no known allergies.   Review of Systems Review of Systems  Psychiatric/Behavioral: Positive for hallucinations.  All other systems reviewed and are negative.    Physical Exam Updated Vital Signs BP 181/99 (BP Location: Left Arm)   Pulse 70   Temp 99.1 F (37.3 C) (Oral)   Resp 17   LMP 12/05/2009   SpO2 98%   Physical Exam  Constitutional: She is oriented to person, place, and time. She appears well-developed and well-nourished.  HENT:  Head: Normocephalic and atraumatic.  Right Ear: External ear normal.  Left Ear: External ear normal.  Nose: Nose normal.  Mouth/Throat: Oropharynx is clear and moist.  Eyes: Conjunctivae and EOM are normal. Pupils are equal, round, and reactive to light.  Neck: Normal range of motion. Neck supple.  Cardiovascular: Normal rate, regular rhythm, normal heart sounds and intact distal pulses.   Pulmonary/Chest: Effort normal and breath sounds normal.  Abdominal: Soft. Bowel sounds are normal.  Musculoskeletal: Normal range of motion.  Neurological: She is alert and oriented to person, place, and time.  Skin: Skin is warm and dry.  Psychiatric: She is actively hallucinating. Thought content is paranoid and delusional.  Nursing note and vitals reviewed.    ED Treatments / Results  Labs (all labs ordered are listed, but only abnormal results are displayed) Labs Reviewed  COMPREHENSIVE METABOLIC PANEL - Abnormal; Notable for the following:       Result Value   Potassium 3.4 (*)    All other components within normal limits  ACETAMINOPHEN LEVEL - Abnormal; Notable for the following:    Acetaminophen (Tylenol), Serum <10 (*)    All other components within normal limits  ETHANOL  SALICYLATE LEVEL  CBC  RAPID URINE DRUG SCREEN, HOSP PERFORMED     EKG  EKG Interpretation None       Radiology No results found.  Procedures Procedures (including critical care time)  Medications Ordered in ED Medications  alum & mag hydroxide-simeth (MAALOX/MYLANTA) 200-200-20 MG/5ML suspension 30 mL (not administered)  ondansetron (ZOFRAN) tablet 4 mg (not administered)  ibuprofen (ADVIL,MOTRIN) tablet 600 mg (not administered)  acetaminophen (TYLENOL) tablet 650 mg (not administered)  LORazepam (ATIVAN) tablet 1 mg (not administered)  hydrochlorothiazide (HYDRODIURIL) tablet 25 mg (not administered)  lisinopril (PRINIVIL,ZESTRIL) tablet 20 mg (not administered)     Initial Impression / Assessment and Plan / ED Course  I have reviewed the triage vital signs and the nursing notes.  Pertinent labs & imaging results that were available during my care of the patient were reviewed by me and considered in my medical decision making (see chart for  details).   Pt is willing to stay for TTS consult.  She does not meet IVC criteria, but does need stabilization.  Pt is medically clear for psych.  Home meds will be reordered.  Disposition pending psych consult.    Final Clinical Impressions(s) / ED Diagnoses   Final diagnoses:  Paranoid schizophrenia [facility])    New Prescriptions New Prescriptions   No medications on file     Jacalyn Lefevre, MD 11/02/16 [phone]

## 2016-10-30 NOTE — ED Notes (Signed)
TTS at bedside speaking with pt 

## 2016-10-30 NOTE — [facility] Counselor (Signed)
Called to set up assessment. Difficutly in getting me to the correct extension. Nurse Eligah EastMike sts pt has just arrived on [facility] and he is getting report & will notify me when pt is ready for assessment. (Also, if pt has been medicated to sleep and too drowsy for assessment.)  Beryle FlockMary Haruto Demaria, MS, CRC, [facility] Triage Specialist [facility]

## 2016-10-30 NOTE — ED Notes (Signed)
Staffing office called for sitter. 

## 2016-10-30 NOTE — ED Notes (Signed)
TTS in progress 

## 2016-10-30 NOTE — ED Triage Notes (Signed)
Pt sent here from internal medicine for "hearing" and "seeing" things. Pt states she has satellites in her and they are sending singals. Pt denies any homicidal and sucisidal thoughts.

## 2016-10-30 NOTE — Progress Notes (Signed)
Internal Medicine Clinic Attending  Case discussed with Dr. Svalina  at the time of the visit.  We reviewed the resident's history and exam and pertinent patient test results.  I agree with the assessment, diagnosis, and plan of care documented in the resident's note.  

## 2016-10-31 DIAGNOSIS — F200 Paranoid schizophrenia: Secondary | ICD-10-CM

## 2016-10-31 DIAGNOSIS — F29 Unspecified psychosis not due to a substance or known physiological condition: Secondary | ICD-10-CM | POA: Diagnosis not present

## 2016-10-31 DIAGNOSIS — Z79899 Other long term (current) drug therapy: Secondary | ICD-10-CM | POA: Diagnosis not present

## 2016-10-31 LAB — RAPID URINE DRUG SCREEN, HOSP PERFORMED
AMPHETAMINES: NOT DETECTED
BENZODIAZEPINES: NOT DETECTED
Barbiturates: NOT DETECTED
COCAINE: NOT DETECTED
OPIATES: NOT DETECTED
TETRAHYDROCANNABINOL: NOT DETECTED

## 2016-10-31 MED ORDER — POTASSIUM CHLORIDE CRYS ER 20 MEQ PO TBCR
40.0000 meq | EXTENDED_RELEASE_TABLET | Freq: Once | ORAL | Status: AC
  Administered 2016-10-31: 40 meq via ORAL
  Filled 2016-10-31: qty 2

## 2016-10-31 NOTE — ED Notes (Signed)
Contacted [facility] who states they will arrive approx 1615 to pick up patient.

## 2016-10-31 NOTE — ED Notes (Signed)
Breakfast tray at bedside 

## 2016-10-31 NOTE — Progress Notes (Signed)
Pt accepted to [facility] [place] ger[facility] unit by Dr. Komissarova. Report # [phone].   Pt voluntary. Barbara in intake states MD agrees to accept pt voluntarily, advising policy is if pt arrives to [facility] and declines to consent to admission, pt will be returned to [facility].  Pt must arrive prior to 11pm today. If transport unavailable, pt's bed will be held until after 6am tomorrow 10/31/16.  Notified [facility].  Amybeth Sieg, MSW, LCSW Clinical Social Work, Disposition  10/31/2016 [phone] 
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
## 2016-10-31 NOTE — Progress Notes (Signed)
Pt assessed via telepsych today and inpatient treatment recommended.  [facility]  Ilean SkillMeghan Teresa Nicodemus, MSW, LCSW Clinical Social Work, Disposition  10/31/2016 [phone]

## 2016-10-31 NOTE — ED Provider Notes (Signed)
Sleeping comfortably. Appears in no distress   Miranda SouSam Filbert Craze, MD 10/31/16 1421

## 2016-10-31 NOTE — ED Provider Notes (Signed)
3:30 PM patient is alert ambulate without difficulty pleasant and cooperative. Stable for transfer to psychiatric facility. Dr.Komissarova is accepting physician   Doug SouSam Caelan Branden, MD 10/31/16 1534

## 2016-10-31 NOTE — ED Notes (Signed)
Pt informed of need for urine sample.

## 2016-10-31 NOTE — ED Notes (Signed)
Pt being reassessed by TTS.

## 2016-11-02 ENCOUNTER — Encounter: Payer: Self-pay | Admitting: Internal Medicine
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
## 2016-11-02 NOTE — Assessment & Plan Note (Signed)
Patient was able to take HCTZ but was not able to fill lisinopril due to low funds. Her BP today is stable in 170s/80s down from 200/100 two days prior. She denies chest pain, shortness of breath, focal weakness, headaches. She has chronic blurry vision which is not worsened but does note mild intermittent "pinching" of her eyes. She has been uncontrolled for several weeks and this is an adequate decrease for now.  Plan: --ultimately needs to be on HCTZ and lisinopril daily --patient with poor resources and is unable to afford even $4 copay for these meds at this time --f/u in clinic; will work with our staff for organizing resources so she may afford her meds

## 2016-11-06 NOTE — Progress Notes (Signed)
Internal Medicine Clinic Attending  Case discussed with Dr. Svalina  at the time of the visit.  We reviewed the resident's history and exam and pertinent patient test results.  I agree with the assessment, diagnosis, and plan of care documented in the resident's note.  

## 2016-11-15 ENCOUNTER — Encounter: Payer: Self-pay | Admitting: Internal Medicine

## 2016-11-15 ENCOUNTER — Ambulatory Visit (INDEPENDENT_AMBULATORY_CARE_PROVIDER_SITE_OTHER): Payer: Self-pay | Admitting: Internal Medicine

## 2016-11-15 VITALS — BP 164/81 | HR 67 | Temp 98.1°F | Ht 62.0 in | Wt 168.3 lb

## 2016-11-15 DIAGNOSIS — J029 Acute pharyngitis, unspecified: Secondary | ICD-10-CM

## 2016-11-15 DIAGNOSIS — R112 Nausea with vomiting, unspecified: Secondary | ICD-10-CM

## 2016-11-15 DIAGNOSIS — J3489 Other specified disorders of nose and nasal sinuses: Secondary | ICD-10-CM

## 2016-11-15 DIAGNOSIS — Z5189 Encounter for other specified aftercare: Secondary | ICD-10-CM

## 2016-11-15 DIAGNOSIS — J302 Other seasonal allergic rhinitis: Secondary | ICD-10-CM

## 2016-11-15 DIAGNOSIS — Z79899 Other long term (current) drug therapy: Secondary | ICD-10-CM

## 2016-11-15 DIAGNOSIS — I10 Essential (primary) hypertension: Secondary | ICD-10-CM

## 2016-11-15 DIAGNOSIS — F200 Paranoid schizophrenia: Secondary | ICD-10-CM

## 2016-11-15 DIAGNOSIS — R51 Headache: Secondary | ICD-10-CM

## 2016-11-15 DIAGNOSIS — J309 Allergic rhinitis, unspecified: Secondary | ICD-10-CM | POA: Insufficient documentation

## 2016-11-15 MED ORDER — FLUTICASONE PROPIONATE 50 MCG/ACT NA SUSP: 1.0000 | Freq: Every day | NASAL | 2 refills | Status: DC

## 2016-11-15 MED ORDER — LISINOPRIL 5 MG PO TABS: 5.0000 mg | ORAL_TABLET | Freq: Every day | ORAL | 11 refills | Status: DC

## 2016-11-15 MED ORDER — FEXOFENADINE HCL 30 MG PO TABS: 30.0000 mg | ORAL_TABLET | Freq: Two times a day (BID) | ORAL | 0 refills | Status: DC

## 2016-11-15 MED ORDER — LISINOPRIL 5 MG PO TABS: 5.0000 mg | ORAL_TABLET | Freq: Every day | ORAL | 2 refills | Status: DC

## 2016-11-15 NOTE — Progress Notes (Signed)
CC: hospital follow up, hypertension  HPI:  Ms.Cathern Blase is a 61 y.o. with a PMH of HTN, paranoid schizophrenia presenting to clinic for follow up of both.   At last clinic visit, patient [facility] uncontrolled and unmanaged paranoid schizophrenia. She was [facility]. Due to history of poor psych follow up and medication noncompliance, patient was started on once monthly [place]Invega Sustenna IM inj; she is next due on 4/19. She received a loading dose on 3/13 and 156mg  dose on 3/19. Patient was to follow up with [facility] [place] she [facility] at the [employer]; she states she thought it was a general medicine doctor and did not go since she wanted to keep following up with us. She states that her auditory hallucinations are improved but still there; she feels that the medicine has helped with that as well as clear some of the trackers in her system. She states that her eye tearing has improved but is still present. She is interested in continuing the medicine; she has no insurance and cannot afford out of pocket cost (~$2000 per her pharmacy).  [facility], HCTZ was continued but lisinopril discontinued. At previous clinic and ED visits she has been very uncontrolled (200s/100s) without symptoms due to medication noncompliance due to financial difficulties and likely somewhat related to her BH illness as well. She continues to deny chest pain, shortness of breath, headaches, vision or hearing changes.  She endorses continued right knee pain. From records at [facility], it looks like she was started on voltaren gel; patient states that was placed right before discharge so she has not actually had any applications; she has not used it at home yet due to not knowing much about the medicine. She asks whether it will help heal her knee.   She endorses  congestion; denies fevers, chills, cough, shortness of breath, nausea, vomiting, or diarrhea. She has not taken anything for this at home.  Please see problem based Assessment and Plan for status of patients chronic conditions.  Past Medical History:  Diagnosis Date  . Anemia 2008   Now resolved, baseline- 12.5  . Heme positive stool 5/10   Colonoscopy 02/2011 showing only internal hemorrhoids  . Hyperlipidemia   . Hypertension   . Internal hemorrhoids    per colonoscopy 02/2011    Review of Systems:   Review of Systems  Constitutional: Negative for chills, fever and weight loss.  HENT: Negative for hearing loss and tinnitus.   Eyes: Negative for blurred vision and double vision.  Respiratory: Negative for cough and shortness of breath.   Cardiovascular: Negative for chest pain, palpitations and leg swelling.  Gastrointestinal: Negative for abdominal pain, diarrhea, nausea and vomiting.  Musculoskeletal: Negative for joint pain and myalgias.  Neurological: Negative for dizziness, sensory change, focal weakness, loss of consciousness and headaches.  Psychiatric/Behavioral: Positive for hallucinations (improving - auditory). Negative for suicidal ideas.    Physical Exam:  Vitals:   11/15/16 1318 11/15/16 1400  BP: (!) 150/82 (!) 164/81  Pulse: 71 67  Temp: 98.1 F (36.7 C)   TempSrc: Oral   SpO2: 100%   Weight: 168 lb 4.8 oz (76.3 kg)   Height: 5\' 2"  (1.575 m)    Physical Exam  Constitutional: She is oriented to person, place, and time. She appears well-developed and well-nourished. No distress.  HENT:  Head: Normocephalic and atraumatic.  No sinus tenderness Bilateral edematous nasal turbirnates   Eyes: Conjunctivae and EOM are normal. Right eye exhibits no discharge. Left eye exhibits no discharge. No scleral icterus.  Neck: Normal range of motion. Neck supple.  Cardiovascular: Normal rate, regular rhythm, normal heart sounds and intact distal pulses.  Exam reveals  no gallop and no friction rub.   No murmur heard. Pulmonary/Chest: Effort normal and breath sounds normal. She has no wheezes. She has no rales.  Abdominal: Soft. Bowel sounds are normal. She exhibits no distension. There is no tenderness. There is no guarding.  Musculoskeletal: Normal range of motion.  No knee tenderness, ROM normal, strength intact  Neurological: She is alert and oriented to person, place, and time. No cranial nerve deficit.  Skin: Skin is warm and dry. Capillary refill takes less than 2 seconds. No rash noted. She is not diaphoretic. No erythema.  Psychiatric: She has a normal mood and affect. Her behavior is normal.  Thought content still abnormal - still thinks there are trackers in her and the noises are still present but patient notes these are getting out of her system slowly.    Assessment & Plan:   See Encounters Tab for problem based charting.   Patient discussed with Dr. Argentina Ponder, MD Internal Medicine PGY1

## 2016-11-15 NOTE — Patient Instructions (Addendum)
For your psychiatrist, go on Wednesday March 28th at 2:30pm to the address below.  [facility] [address] [place], [place] [address] Phone: [phone] Fax: [phone]  For your blood pressure, continue the hydrochlorothiazide. I sent a lower dose of Lisinopril to [facility], take one pill once a day.   For your congestion, I sent in scripts for Flonase nasal spray and allegra, an antihistamine. Use both once a day.  Please make an appointment

## 2016-11-17 ENCOUNTER — Encounter: Payer: Self-pay | Admitting: Internal Medicine

## 2016-11-17 NOTE — Assessment & Plan Note (Signed)
While hospitalized at [facility], patient experienced low BPs on both HCTZ [facility]25mg  daily and lisinopril 20mg  daily so lisinopril was stopped. Patient has been compliant with HCTZ 25mg  daily. She denies headaches, vision or hearing changes, chest pain, shortness of breath. BP still elevated today but much improved from prior visit (SBP 150-160s/ diastolic in 80s).  Plan: --continue HCTZ 25mg  daily --start lisinopril 5mg  daily (lower dose) --f/u in 2 weeks for BP check and med adjustment

## 2016-11-17 NOTE — Assessment & Plan Note (Signed)
Patient with congestion, rhinorrhea without cough, sore throat, headache, sinus pain, fevers, chills, nausea, vomiting.  Exam is positive for edematous nasal turbinates; no sinus tenderness, no oropharyngeal erythema or exudate. Symptoms consistent with likely allergic rhinitis.  Plan: --symptomatic treatment with allegra and flonase

## 2016-11-17 NOTE — Assessment & Plan Note (Signed)
At last clinic visit, patient was [facility] her uncontrolled and unmanaged paranoid schizophrenia. She [facility]. Due to history of poor psych follow up and medication noncompliance, patient was started on once monthly [place]vega Sustenna IM inj; she is next due on 4/19. She received a loading dose on 3/13 and 156mg  dose on 3/19. Patient was to follow up with [facility] [place] she [facility] the [employer]; she states she thought it was a general medicine doctor and did not go since she wanted to keep following up with us. She states that her auditory hallucinations are improved but still there; she feels that the medicine has helped with that, as well as clear some of the trackers in her system. She states that her eye tearing has improved but is still present. She is interested in continuing the medicine; she has no insurance and cannot afford out of pocket cost (~$2000 per her pharmacy).  I called and rescheduled patient's appointment with [facility] [place] for March 28th at 2:30pm. Hopefully they will be able to help with medication coordination and administration.  Plan: --continue Gean [place]vega Sustenna 156mg  IM q30days --patient to follow up with [facility] in [place] - patient provided with appointment time and address.

## 2016-11-18 NOTE — Progress Notes (Signed)
Medicine attending: Medical history, presenting problems, physical findings, and medications, reviewed with resident physician Dr Gorica Svalina on the day of the patient visit and I concur with her evaluation and management plan. 

## 2016-11-21 ENCOUNTER — Telehealth: Payer: Self-pay | Admitting: Internal Medicine

## 2016-11-21 NOTE — Telephone Encounter (Signed)
PATIENT CALLED AND REQUESTED AN APPLICATION TO APPLY FOR BUS PASSES UNDER THE [employer] CARD PROGRAM, MAILED APPLICATION [facility] [place] [facility] TODAY.

## 2016-12-13 ENCOUNTER — Ambulatory Visit (INDEPENDENT_AMBULATORY_CARE_PROVIDER_SITE_OTHER): Payer: Self-pay | Admitting: Internal Medicine

## 2016-12-13 ENCOUNTER — Encounter: Payer: Self-pay | Admitting: Internal Medicine

## 2016-12-13 VITALS — BP 170/84 | HR 66 | Temp 98.2°F | Ht 62.0 in | Wt 168.7 lb

## 2016-12-13 DIAGNOSIS — I10 Essential (primary) hypertension: Secondary | ICD-10-CM

## 2016-12-13 DIAGNOSIS — Z79899 Other long term (current) drug therapy: Secondary | ICD-10-CM

## 2016-12-13 MED ORDER — LISINOPRIL 10 MG PO TABS: 5.0000 mg | ORAL_TABLET | Freq: Every day | ORAL | 2 refills | Status: DC

## 2016-12-13 NOTE — Progress Notes (Deleted)
   CC: ***  HPI:  Ms.Shylynn Clements is a 61 y.o.   Past Medical History:  Diagnosis Date  . Anemia 2008   Now resolved, baseline- 12.5  . Heme positive stool 5/10   Colonoscopy 02/2011 showing only internal hemorrhoids  . Hyperlipidemia   . Hypertension   . Internal hemorrhoids    per colonoscopy 02/2011    Review of Systems:  ***  Physical Exam:  There were no vitals filed for this visit. ***  Assessment & Plan:   See Encounters Tab for problem based charting.  Patient {GC/GE:[id number]::"discussed with","seen with"} Dr. {NAMES:[id number]::"Butcher","Granfortuna","E. Hoffman","Klima","Mullen","Narendra","Vincent"}

## 2016-12-13 NOTE — Progress Notes (Signed)
   CC: HTN follow up  HPI:  Miranda Griffin is a 61 y.o. female with a past medical history listed below here today for follow up of her HTN.  For details of today's visit and the status of her chronic medical issues please refer to the assessment and plan.   Past Medical History:  Diagnosis Date  . Anemia 2008   Now resolved, baseline- 12.5  . Heme positive stool 5/10   Colonoscopy 02/2011 showing only internal hemorrhoids  . Hyperlipidemia   . Hypertension   . Internal hemorrhoids    per colonoscopy 02/2011    Review of Systems:  No chest pain, shortness of breath  Physical Exam:  Vitals:   12/13/16 1517  BP: (!) 170/84  Pulse: 66  Temp: 98.2 F (36.8 C)  TempSrc: Oral  SpO2: 100%  Weight: 168 lb 11.2 oz (76.5 kg)  Height:  (1.575 m)   Physical Exam  Constitutional: She is well-developed, well-nourished, and in no distress.  Cardiovascular: Normal rate.   Pulmonary/Chest: Effort normal and breath sounds normal.  Abdominal: Soft. Bowel sounds are normal.  Skin: Skin is warm and dry.  Psychiatric: Her mood appears anxious.  Vitals reviewed.    Assessment & Plan:   See Encounters Tab for problem based charting.  Patient discussed with Dr. Cyndie Chime

## 2016-12-13 NOTE — Patient Instructions (Signed)
Please start taking the lisinopril. Follow up for BP check in 1 month.

## 2016-12-18 NOTE — Progress Notes (Signed)
Medicine attending: Medical history, presenting problems, physical findings, and medications, reviewed with resident physician Dr Nathan Boswell on the day of the patient visit and I concur with his evaluation and management plan. 

## 2016-12-18 NOTE — Assessment & Plan Note (Signed)
BP Readings from Last 3 Encounters:  12/13/16 (!) 170/84  11/15/16 (!) 164/81  10/31/16 128/70    Lab Results  Component Value Date   NA 139 10/30/2016   K 3.4 (L) 10/30/2016   CREATININE 0.89 10/30/2016   BP today is 170/84 today. She was seen in clinic on 3/23 following recent hospitalization at behavioral health. She had low BPs at that time and all of her lisinopril was discontinued. At follow up visit BP was elevated in the 150-160s systolic. She was still on HCTZ 25 mg and she was restarted on lisinopril 5 mg daily.  Today she reports she did not get the lisinopril filled. Says she is still processing her insurance. Tells me today that she can afford the medication today as it is on the $4 list at [facility]. Denies any headaches, vision changes, chest pain, shortness of breath, dizziness/lightheadedness today.    Assessment: Uncontrolled HTN  Plan: Will re-send Rx for lisinopril. Follow up in 2 weeks after she has started the medication.

## 2017-05-13 ENCOUNTER — Ambulatory Visit (INDEPENDENT_AMBULATORY_CARE_PROVIDER_SITE_OTHER): Payer: Medicaid Other | Admitting: Internal Medicine

## 2017-05-13 ENCOUNTER — Ambulatory Visit: Payer: Self-pay

## 2017-05-13 VITALS — BP 146/86 | HR 56 | Temp 97.9°F | Ht 62.0 in | Wt 156.5 lb

## 2017-05-13 DIAGNOSIS — F200 Paranoid schizophrenia: Secondary | ICD-10-CM

## 2017-05-13 DIAGNOSIS — K219 Gastro-esophageal reflux disease without esophagitis: Secondary | ICD-10-CM | POA: Diagnosis not present

## 2017-05-13 DIAGNOSIS — Z23 Encounter for immunization: Secondary | ICD-10-CM

## 2017-05-13 DIAGNOSIS — I10 Essential (primary) hypertension: Secondary | ICD-10-CM | POA: Diagnosis present

## 2017-05-13 DIAGNOSIS — E785 Hyperlipidemia, unspecified: Secondary | ICD-10-CM | POA: Diagnosis not present

## 2017-05-13 MED ORDER — LISINOPRIL 10 MG PO TABS: 10.0000 mg | ORAL_TABLET | Freq: Every day | ORAL | 2 refills | Status: DC

## 2017-05-13 MED ORDER — OMEPRAZOLE 20 MG PO TBDD: 20.0000 mg | DELAYED_RELEASE_TABLET | ORAL | 2 refills | Status: DC

## 2017-05-13 NOTE — Assessment & Plan Note (Signed)
Patient endorses feeling of food stuck in her throat which occurs episodically every couple of weeks and will last for a couple of days. She states that it almost always starts a couple of hours after dinner. The first episode occurred after eating oranges, second episode was after eating other fruit. She endorses feeling of epigastric pain and acid reflux during these episodes. She denies distinct food associated dysphagia, odynophagia, nausea, vomiting, regurgitation. She has a good appetite. She denies fevers, chills or weight loss.  Her symptoms are consistent with GERD.   Plan: --start omeprazole  daily --advised on proper administration of PPI --advised to avoid foods which exacerbate her symptoms such as fruits, coffee, fried foods

## 2017-05-13 NOTE — Assessment & Plan Note (Addendum)
Patient was previously being followed at [facility] in [facility] for her paranoid schizophrenia which revolves around her previous knee surgery. She believes the surgeon implanted a computer and tracking device into her knee; she endorses auditory hallucinations of satellite beeping as well as voices which she denies give her commands. She believes she is being tracked by the government for an unknown reason to her and that the computer in her knee is recording and broadcasting all of her thoughts. She finds this interferes with her life. She denies suicidal or homicidal ideation. She has not followed up with psychiatry in a couple of months due to SE of her medications. She is hesitant to go back due to not thinking they will be able to help.  Plan: --patient not a threat to herself or others at this time  --advised patient to f/u with psychiatry --advised patient to go to ED if she has SI/HI

## 2017-05-13 NOTE — Patient Instructions (Addendum)
Continue taking HCTZ  daily Increase your lisinopril to one tablet ( ) daily  Start taking omeprazole  (one tab) 30 minutes before breakfast each day to help with your stomach and throat tightness. Try to avoid acidic foods like oranges, lemons, avoid coffee, avoid fried foods.  Please f/u with  [facility] [address] [place], [place] [address] Phone: [phone]  If you have any thoughts of hurting yourself or someone else, please go to the emergency room.

## 2017-05-13 NOTE — Progress Notes (Signed)
   CC: blood pressure, throat pain  HPI:  Miranda Griffin is a 61 y.o. with a PMH of hypertension and paranoid schizophrenia presenting to clinic for f/u on her hypertension and evaluation of throat pain.  Patient states she is complaint with lisinopril  daily and HCTZ  daily. She denies chest pain, shortness of breath, headaches, vision or hearing changes. She denies LE swelling.   Patient endorses feeling of food stuck in her throat which occurs episodically every couple of weeks and will last for a couple of days. She states that it almost always starts a couple of hours after dinner. The first episode occurred after eating oranges, second episode was after eating other fruit. She endorses feeling of epigastric pain and acid reflux during these episodes. She denies distinct food associated dysphagia, odynophagia, nausea, vomiting, regurgitation. She has a good appetite. She denies fevers, chills or weight loss.   Patient was previously being followed at [facility] for her paranoid schizophrenia which revolves around her previous knee surgery. She believes the surgeon implanted a computer and tracking device into her knee; she endorses auditory hallucinations of satellite beeping as well as voices which she denies give her commands. She believes she is being tracked by the government for an unknown reason to her and that the computer in her knee is recording and broadcasting all of her thoughts. She finds this interferes with her life. She denies suicidal or homicidal ideation. She has not followed up with psychiatry in a couple of months due to SE of her medications. She is hesitant to go back due to not thinking they will be able to help.  Please see problem based Assessment and Plan for status of patients chronic conditions.  Past Medical History:  Diagnosis Date  . Anemia 2008   Now resolved, baseline- 12.5  . Heme positive stool 5/10   Colonoscopy 02/2011 showing only  internal hemorrhoids  . Hyperlipidemia   . Hypertension   . Internal hemorrhoids    per colonoscopy 02/2011    Review of Systems:   ROS  Per HPI Physical Exam:  Vitals:   05/13/17 1104  BP: (!) 143/71  Pulse: (!) 58  Temp: 97.9 F (36.6 C)  TempSrc: Oral  SpO2: 100%  Weight: 156 lb 8 oz (71 kg)  Height:  (1.575 m)   GENERAL- alert, co-operative, appears as stated age, not in any distress. HEENT- Atraumatic, normocephalic, EOMI CARDIAC- RRR, no murmurs, rubs or gallops. RESP- Moving equal volumes of air, and clear to auscultation bilaterally, no wheezes or crackles. ABDOMEN- Soft, nontender, bowel sounds present. NEURO- No obvious Cr N abnormality. EXTREMITIES- pulse 2+, symmetric, no pedal edema. SKIN- Warm, dry, no rash or lesion. PSYCH- Normal mood and affect, inappropriate thought content.   Assessment & Plan:   See Encounters Tab for problem based charting.   Patient discussed with Dr. Cleda Daub   Nyra Market, MD Internal Medicine PGY2

## 2017-05-13 NOTE — Assessment & Plan Note (Signed)
Improved though still uncontrolled.  Plan: --increase lisinopril to  daily --continue HCTZ  daily --f/u in 1 month for BP check

## 2017-05-14 ENCOUNTER — Ambulatory Visit: Payer: Self-pay

## 2017-05-14 NOTE — Progress Notes (Signed)
Internal Medicine Clinic Attending  Case discussed with Dr. Svalina  at the time of the visit.  We reviewed the resident's history and exam and pertinent patient test results.  I agree with the assessment, diagnosis, and plan of care documented in the resident's note.  

## 2017-12-01 ENCOUNTER — Encounter (HOSPITAL_COMMUNITY): Payer: Self-pay | Admitting: Neurology

## 2017-12-01 ENCOUNTER — Other Ambulatory Visit: Payer: Self-pay

## 2017-12-01 ENCOUNTER — Emergency Department (HOSPITAL_COMMUNITY)
Admission: EM | Admit: 2017-12-01 | Discharge: 2017-12-01 | Disposition: A | Discharge status: Home / Self Care | Payer: Medicaid Other | Attending: Emergency Medicine | Admitting: Emergency Medicine

## 2017-12-01 DIAGNOSIS — L089 Local infection of the skin and subcutaneous tissue, unspecified: Secondary | ICD-10-CM

## 2017-12-01 DIAGNOSIS — I10 Essential (primary) hypertension: Secondary | ICD-10-CM | POA: Insufficient documentation

## 2017-12-01 DIAGNOSIS — L720 Epidermal cyst: Secondary | ICD-10-CM | POA: Insufficient documentation

## 2017-12-01 DIAGNOSIS — Z79899 Other long term (current) drug therapy: Secondary | ICD-10-CM | POA: Insufficient documentation

## 2017-12-01 MED ORDER — ACETAMINOPHEN 500 MG PO TABS
1000.0000 mg | ORAL_TABLET | Freq: Once | ORAL | Status: AC
  Administered 2017-12-01: 1000 mg via ORAL
  Filled 2017-12-01: qty 2

## 2017-12-01 MED ORDER — SULFAMETHOXAZOLE-TRIMETHOPRIM 800-160 MG PO TABS: 1.0000 | ORAL_TABLET | Freq: Two times a day (BID) | ORAL | 0 refills | Status: DC

## 2017-12-01 MED ORDER — LIDOCAINE-EPINEPHRINE (PF) 2 %-1:200000 IJ SOLN
10.0000 mL | Freq: Once | INTRAMUSCULAR | Status: AC
  Administered 2017-12-01: 10 mL
  Filled 2017-12-01: qty 20

## 2017-12-01 NOTE — ED Provider Notes (Addendum)
[facility] EMERGENCY DEPARTMENT Provider Note   CSN: [id number] Arrival date & time: 12/01/17  4098     History   Chief Complaint Chief Complaint  Patient presents with  . Cyst    HPI Miranda Griffin is a 62 y.o. female with history of hypertension, hyperlipidemia, schizophrenia here for evaluation of lump to center of her chest wall/between her breasts that has been slowly growing over the last 2-3 months, the lump has actually been there for several years. Feels like her bra is rubbing on it and making it irritated. Woke up this morning and noticed bloody drainage on her shirt, she was able to apply pressure and obtained more bloody/yellow drainage. Lump is now more tender. She denies fevers, chills, IV drug use.  HPI  Past Medical History:  Diagnosis Date  . Anemia 2008   Now resolved, baseline- 12.5  . Hyperlipidemia   . Hypertension   . Internal hemorrhoids    per colonoscopy 02/2011    Patient Active Problem List   Diagnosis Date Noted  . GERD (gastroesophageal reflux disease) 05/13/2017  . Allergic rhinitis 11/15/2016  . Paranoid schizophrenia (HCC)   . Obesity (BMI 30.0-34.9) 11/14/2011  . Internal hemorrhoids   . Preventative health care 12/07/2010  . CONSTIPATION, INTERMITTENT 12/27/2009  . HYPERLIPIDEMIA 03/05/2007  . Essential hypertension 09/25/2006    Past Surgical History:  Procedure Laterality Date  . CESAREAN SECTION     x2, 1989, 1990  . ORIF TIBIA PLATEAU Right 01/2010   with bone graft, plates and screws     OB History   None      Home Medications    Prior to Admission medications   Medication Sig Start Date End Date Taking? Authorizing Provider  fexofenadine (ALLEGRA) 30 MG tablet Take 1 tablet (30 mg total) by mouth 2 (two) times daily. 11/15/16   Nyra Market, MD  fluticasone (FLONASE) 50 MCG/ACT nasal spray Place 1 spray into both nostrils daily. 11/15/16 11/15/17  Nyra Market, MD  hydrochlorothiazide  (HYDRODIURIL) 25 MG tablet Take 1 tablet (25 mg total) by mouth daily. 10/28/16   Nyra Market, MD  ibuprofen (ADVIL,MOTRIN) 200 MG tablet Take 400 mg by mouth every 6 (six) hours as needed (for pain or headaches).     [provider]  lisinopril (PRINIVIL,ZESTRIL) 10 MG tablet Take 1 tablet (10 mg total) by mouth daily. 05/13/17 05/13/18  Nyra Market, MD  Omeprazole 20 MG TBDD Take 20 mg by mouth every morning. 05/13/17   Nyra Market, MD  sulfamethoxazole-trimethoprim (BACTRIM DS,SEPTRA DS) 800-160 MG tablet Take 1 tablet by mouth 2 (two) times daily for 7 days. 12/01/17 12/08/17  Liberty Handy, PA-C    Family History Family History  Problem Relation Age of Onset  . Hypertension Father   . Vision loss Father   . Vision loss Paternal Grandmother   . Hypertension Other   . Hyperlipidemia Other     Social History Social History   Tobacco Use  . Smoking status: Never Smoker  . Smokeless tobacco: Never Used  Substance Use Topics  . Alcohol use: No    Comment: occasional wine  . Drug use: No     Allergies   Patient has no known allergies.   Review of Systems Review of Systems  Skin:       "bump" to chest wall   All other systems reviewed and are negative.    Physical Exam Updated Vital Signs BP (!) 167/107 (BP Location: Right Arm)  Comment: took BP med (lisinopril at 0630 this AM), needs to schedule appointment with PCP for BP re-eval  Pulse 69   Temp 98.5 F (36.9 C) (Oral)   LMP 12/05/2009   SpO2 100%   Physical Exam  Constitutional: She is oriented to person, place, and time. She appears well-developed and well-nourished. No distress.  NAD.  HENT:  Head: Normocephalic and atraumatic.  Right Ear: External ear normal.  Left Ear: External ear normal.  Nose: Nose normal.  Eyes: Conjunctivae and EOM are normal. No scleral icterus.  Neck: Normal range of motion. Neck supple.  Cardiovascular: Normal rate, regular rhythm and normal heart sounds.  No  murmur heard. Pulmonary/Chest: Effort normal and breath sounds normal. She has no wheezes.  Musculoskeletal: Normal range of motion. She exhibits no deformity.  Neurological: She is alert and oriented to person, place, and time.  Skin: Skin is warm and dry. Capillary refill takes less than 2 seconds.  Quarter sized area of fluctuance, erythema, mild tenderness to center of the chest approximately over her xiphoid process, mild pressure produces purulent drainage.  Psychiatric: She has a normal mood and affect. Her behavior is normal. Judgment and thought content normal.  Nursing note and vitals reviewed.    ED Treatments / Results  Labs (all labs ordered are listed, but only abnormal results are displayed) Labs Reviewed - No data to display  EKG None  Radiology No results found.  Procedures  Addendum  .Marland KitchenIncision and Drainage Date/Time: 12/14/2017 12:04 PM Performed by: Liberty Handy, PA-C Authorized by: Liberty Handy, PA-C   Consent:    Consent obtained:  Verbal   Consent given by:  Patient   Risks discussed:  Bleeding, incomplete drainage and pain   Alternatives discussed:  Referral Location:    Indications for incision and drainage: infected cyst.   Size:  Approx quarter sized    Location:  Trunk   Trunk location:  Chest Pre-procedure details:    Procedure prep: alcohol wipe. Anesthesia (see MAR for exact dosages):    Anesthesia method:  Local infiltration   Local anesthetic:  Lidocaine 2% WITH epi Procedure type:    Complexity:  Simple Procedure details:    Needle aspiration: no     Incision types:  Single straight   Scalpel blade:  11   Wound management:  Probed and deloculated   Drainage:  Purulent   Drainage amount:  Moderate   Wound treatment:  Wound left open   Packing materials:  None Post-procedure details:    Patient tolerance of procedure:  Tolerated well, no immediate complications   (including critical care time)  Medications Ordered  in ED Medications  lidocaine-EPINEPHrine (XYLOCAINE W/EPI) 2 %-1:200000 (PF) injection 10 mL (10 mLs Infiltration Given by Other 12/01/17 0937)  acetaminophen (TYLENOL) tablet 1,000 mg (1,000 mg Oral Given 12/01/17 0936)     Initial Impression / Assessment and Plan / ED Course  I have reviewed the triage vital signs and the nursing notes.  Pertinent labs & imaging results that were available during my care of the patient were reviewed by me and considered in my medical decision making (see chart for details).      62 y.o. yo female with abscess, given history likely infected cyst. No associated fever. I&D performed in ED with adequate evacuation of purulent discharge. Will discharge with warm compresses, NSAIDs and I&D care instructions. DC with bactrim. May need surgery/derm for recurrent cyst infection. Patient aware of symptoms that would warrant return to  ED for re-evaluation and treatment. Patient verbalized understanding and is agreeable with plan.   Final Clinical Impressions(s) / ED Diagnoses   Final diagnoses:  Infected epidermoid cyst    ED Discharge Orders        Ordered    sulfamethoxazole-trimethoprim (BACTRIM DS,SEPTRA DS) 800-160 MG tablet  2 times daily     12/01/17 1009       Liberty HandyGibbons, Vidyuth Belsito J, [place]PA-C 12/01/17 1014    Melene PlanFloyd, Dan, DO 12/01/17 1527  Addendum I&D Procedure note   Liberty HandyGibbons, Danniel Grenz J, PA-C 12/14/17 1206    Melene PlanFloyd, Dan, DO 12/14/17 1458

## 2017-12-01 NOTE — Discharge Instructions (Signed)
Suspect that you had a cyst that became infected. This was strange. Take your antibiotics as prescribed until completed. Can take Tylenol or ibuprofen for associated pain. Recommend you do warm massage under the shower with clean hands to help continue draining. There is cyst continues to get infected you may needed to be removed by general surgery or dermatology. Return for worsening swelling, pain, redness, fevers.

## 2017-12-01 NOTE — ED Triage Notes (Signed)
Pt reports "bump" in between breasts for several months, this morning she noticed that it was draining. She thinks her bra has irritated the area

## 2017-12-08 ENCOUNTER — Ambulatory Visit (INDEPENDENT_AMBULATORY_CARE_PROVIDER_SITE_OTHER): Payer: Self-pay | Admitting: Internal Medicine

## 2017-12-08 ENCOUNTER — Other Ambulatory Visit: Payer: Self-pay

## 2017-12-08 VITALS — BP 178/77 | HR 65 | Temp 97.8°F | Ht 62.0 in | Wt 167.5 lb

## 2017-12-08 DIAGNOSIS — F200 Paranoid schizophrenia: Secondary | ICD-10-CM

## 2017-12-08 DIAGNOSIS — Z6830 Body mass index (BMI) 30.0-30.9, adult: Secondary | ICD-10-CM

## 2017-12-08 DIAGNOSIS — E669 Obesity, unspecified: Secondary | ICD-10-CM

## 2017-12-08 DIAGNOSIS — K219 Gastro-esophageal reflux disease without esophagitis: Secondary | ICD-10-CM

## 2017-12-08 DIAGNOSIS — R51 Headache: Secondary | ICD-10-CM

## 2017-12-08 DIAGNOSIS — J309 Allergic rhinitis, unspecified: Secondary | ICD-10-CM

## 2017-12-08 DIAGNOSIS — I10 Essential (primary) hypertension: Secondary | ICD-10-CM

## 2017-12-08 DIAGNOSIS — Z79899 Other long term (current) drug therapy: Secondary | ICD-10-CM

## 2017-12-08 DIAGNOSIS — N6002 Solitary cyst of left breast: Secondary | ICD-10-CM

## 2017-12-08 DIAGNOSIS — J302 Other seasonal allergic rhinitis: Secondary | ICD-10-CM

## 2017-12-08 MED ORDER — LISINOPRIL-HYDROCHLOROTHIAZIDE 20-25 MG PO TABS: 1.0000 | ORAL_TABLET | Freq: Every day | ORAL | 2 refills | Status: DC

## 2017-12-08 MED ORDER — CETIRIZINE HCL 10 MG PO TBDP
10.0000 mg | ORAL_TABLET | Freq: Every day | ORAL | 0 refills | Status: DC
Start: 2017-12-08 — End: 2018-09-09

## 2017-12-08 MED FILL — LISINOPRIL-HCTZ 20-25 MG TA: 20-25 | 30 days supply | Qty: 30 | Fill #0

## 2017-12-08 NOTE — Progress Notes (Signed)
   CC: follow-up of ED visit, HTN  HPI:  [name] is a 62 y.o. F with uncontrolled HTN, paranoid schizophrenia, GERD, obesity and allergic rhinitis who presents today for follow-up of her recent ED visit. She was seen in ED 4/8 with an infected cyst on her breast. She underwent I&D and completed 1 week course of bactrim with significant improvement. She was also noted to be hypertensive as well.   For details regarding today's visit and the status of their chronic medical issues, please refer to the assessment and plan.  Past Medical History:  Diagnosis Date  . Anemia 2008   Now resolved, baseline- 12.5  . Hyperlipidemia   . Hypertension   . Internal hemorrhoids    per colonoscopy 02/2011   Review of Systems:   General: +Headache. Denies fevers, chills, weight loss HEENT: +Watery eyes, sore throat. Denies changes in vision, sinus congestion or tenderness Cardiac: Denies CP, SOB, palpitations Pulmonary: Denies cough, wheezing Extremities: Denies weakness or swelling  Physical Exam: General: Alert, in no acute distress. Pleasant and conversant HEENT: +Mild injection with watering eyes. No icterus, injection or ptosis. No hoarseness or dysarthria  Cardiac: RRR, no MGR appreciated Pulmonary: CTA BL with normal WOB on RA. Able to speak in complete sentences Abd: Soft, non-tender. +bs Extremities: Warm, perfused. No significant pedal edema.  Chest: Small well-healing linear incision on medial aspect left breast. No purulence or surrounding erythema.   Vitals:   12/08/17 0857 12/08/17 0928  BP: (!) 186/80 (!) 178/77  Pulse: 61 65  Temp: 97.8 F (36.6 C)   TempSrc: Oral   SpO2: 100% 100%  Weight: 167 lb 8 oz (76 kg)   Height: 5\' 2"  (1.575 m)    Body mass index is 30.64 kg/m.  Assessment & Plan:   See Encounters Tab for problem based charting.  Patient discussed with Dr. Cleda DaubE. Hoffman

## 2017-12-08 NOTE — Assessment & Plan Note (Signed)
Assessment: Miranda Griffin was hypertensive today to 186/80 and did not improve on re-check. She has mild headache, but denied any blurred vision or weakness. She last took her Lisinopril this morning and has not missed any doses, but does need a refill as she just ran out this morning. She was unaware HCTZ 25 mg was also recommended but thinks she's been on this in the past.   She states she has always had trouble with her BP and is not particularly worried. We discussed consequences of long-term uncontrolled HTN and that her recent headaches could be related to her high blood pressure.   Plan: Transition patient to Lisinopril-HCTZ 20-25mg  daily. Rx [facility]. Will check BMET today and have asked patient to return both in 1 month for BP recheck and also in July to touch base with her PCP at her next available appointment.

## 2017-12-08 NOTE — Assessment & Plan Note (Addendum)
Assessment & Plan: Pt mentioned at end of visit that she continues to experience some clear rhinorrhea, watery eyes and admits to pos-nasal drip. Was taking Allegra with some improvement. Recommended Flonase and Cetirizine.

## 2017-12-08 NOTE — Patient Instructions (Signed)
It was great meeting you today. I'm glad your infection has healed nicely.   Today we talked about your blood pressure. I have sent in a new blood pressure medication which is a combination pill of the 2 that you were on prior to this. Please take Lisinopril-HCTZ 20-25mg  daily.   I am going to check your kidney function today.   I would like you to return to clinic in 1 month to check your blood pressure and also make an appointment in July to see Dr. Samuella CotaSvalina!

## 2017-12-08 NOTE — Assessment & Plan Note (Signed)
Assessment & Plan: S/p I&D in ED 4/8 and 1-week course of Bactrim. Site much improved and is without tenderness or purulence. Continue to monitor, no surgical indication currently.

## 2017-12-09 LAB — BMP8+ANION GAP
Anion Gap: 14 mmol/L (ref 10.0–18.0)
BUN/Creatinine Ratio: 12 (ref 12–28)
BUN: 13 mg/dL (ref 8–27)
CALCIUM: 9.9 mg/dL (ref 8.7–10.3)
CO2: 22 mmol/L (ref 20–29)
CREATININE: 1.11 mg/dL — AB (ref 0.57–1.00)
Chloride: 104 mmol/L (ref 96–106)
GFR calc Af Amer: 62 mL/min/{1.73_m2} (ref >59)
GFR, EST NON AFRICAN AMERICAN: 54 mL/min/{1.73_m2} — AB (ref >59)
Glucose: 89 mg/dL (ref 65–99)
Potassium: 5.1 mmol/L (ref 3.5–5.2)
Sodium: 140 mmol/L (ref 134–144)

## 2017-12-09 NOTE — Progress Notes (Signed)
Internal Medicine Clinic Attending  Case discussed with Dr. Molt at the time of the visit.  We reviewed the resident's history and exam and pertinent patient test results.  I agree with the assessment, diagnosis, and plan of care documented in the resident's note. 

## 2018-01-02 ENCOUNTER — Emergency Department (HOSPITAL_COMMUNITY): Payer: Self-pay

## 2018-01-02 ENCOUNTER — Encounter (HOSPITAL_COMMUNITY): Payer: Self-pay | Admitting: *Deleted

## 2018-01-02 ENCOUNTER — Emergency Department (HOSPITAL_COMMUNITY)
Admission: EM | Admit: 2018-01-02 | Discharge: 2018-01-02 | Disposition: A | Discharge status: Home / Self Care | Payer: Self-pay | Attending: Emergency Medicine | Admitting: Emergency Medicine

## 2018-01-02 ENCOUNTER — Other Ambulatory Visit: Payer: Self-pay

## 2018-01-02 DIAGNOSIS — R1310 Dysphagia, unspecified: Secondary | ICD-10-CM

## 2018-01-02 DIAGNOSIS — T17308A Unspecified foreign body in larynx causing other injury, initial encounter: Secondary | ICD-10-CM

## 2018-01-02 DIAGNOSIS — R4702 Dysphasia: Secondary | ICD-10-CM | POA: Insufficient documentation

## 2018-01-02 DIAGNOSIS — Z79899 Other long term (current) drug therapy: Secondary | ICD-10-CM | POA: Insufficient documentation

## 2018-01-02 DIAGNOSIS — I10 Essential (primary) hypertension: Secondary | ICD-10-CM | POA: Insufficient documentation

## 2018-01-02 LAB — CBC WITH DIFFERENTIAL/PLATELET
BASOS ABS: 0 10*3/uL (ref 0.0–0.1)
Basophils Relative: 0 %
EOS PCT: 0 %
Eosinophils Absolute: 0 10*3/uL (ref 0.0–0.7)
HCT: 38.2 % (ref 36.0–46.0)
Hemoglobin: 12.4 g/dL (ref 12.0–15.0)
LYMPHS PCT: 33 %
Lymphs Abs: 1.8 10*3/uL (ref 0.7–4.0)
MCH: 27.9 pg (ref 26.0–34.0)
MCHC: 32.5 g/dL (ref 30.0–36.0)
MCV: 86 fL (ref 78.0–100.0)
Monocytes Absolute: 0.5 10*3/uL (ref 0.1–1.0)
Monocytes Relative: 9 %
NEUTROS ABS: 3.3 10*3/uL (ref 1.7–7.7)
Neutrophils Relative %: 58 %
Platelets: 218 10*3/uL (ref 150–400)
RBC: 4.44 MIL/uL (ref 3.87–5.11)
RDW: 13.8 % (ref 11.5–15.5)
WBC: 5.6 10*3/uL (ref 4.0–10.5)

## 2018-01-02 LAB — BASIC METABOLIC PANEL
Anion gap: 10 (ref 5–15)
BUN: 10 mg/dL (ref 6–20)
CO2: 24 mmol/L (ref 22–32)
Calcium: 9.4 mg/dL (ref 8.9–10.3)
Chloride: 105 mmol/L (ref 101–111)
Creatinine, Ser: 0.85 mg/dL (ref 0.44–1.00)
GFR calc Af Amer: >60 mL/min (ref >60)
GFR calc non Af Amer: >60 mL/min (ref >60)
Glucose, Bld: 93 mg/dL (ref 65–99)
Potassium: 5.3 mmol/L — ABNORMAL HIGH (ref 3.5–5.1)
Sodium: 139 mmol/L (ref 135–145)

## 2018-01-02 MED ORDER — IOHEXOL 300 MG/ML  SOLN
100.0000 mL | Freq: Once | INTRAMUSCULAR | Status: AC | PRN
  Administered 2018-01-02: 100 mL via INTRAVENOUS

## 2018-01-02 NOTE — ED Triage Notes (Addendum)
States this am she woke up with sharp pain in her throat, c/o difficulty swallowing, states it it feels like something in the back of her throat is swollen. C/o sob states this actually started around 4am,

## 2018-01-02 NOTE — ED Provider Notes (Signed)
3:49 PM Care assumed from [facility].  At time of transfer care, patient is awaiting diagnostic laboratory and imaging work-up to determine the etiology of her choking sensation and throat discomfort.  Anticipate reassessment after work-up.  8:41 PM Diagnostic work-up has completed.  Patient's laboratory testing was overall reassuring with slight hyperkalemia.  EKG showed no significant arrhythmias and no STEMI.  Chest x-ray was reassuring And CT scan only revealed widespread pharyngeal mucosal hyperenhancement concerning for an acute mucositis.  On reassessment patient has no further sore throat and no choking.  Patient was able to tolerate p.o. without difficulty.  No swallowing or breathing problems.  Do not feel patient has a pharyngeal or parapharyngeal abscess and no retropharyngeal abnormality on imaging.  Patient was reassessed and had no stridor patient was tolerating her secretions.  Patient appears well.    Given reassuring work-up, patient was felt to be safe for discharge to follow-up with PCP.  Patient understood return precautions and instructed to stay hydrated.    Patient agreed with plan of care and was discharged in good condition.  As per the previous team, patient will stop her lisinopril although we do not feel this was an angioedema was considered.  Patient can follow-up with her PCP to discuss further blood pressure management.   Clinical Impression: 1. Dysphagia, unspecified type     Disposition: Discharge  Condition: Good  I have discussed the results, Dx and Tx plan with the pt(& family if present). He/she/they expressed understanding and agree(s) with the plan. Discharge instructions discussed at great length. Strict return precautions discussed and pt &/or family have verbalized understanding of the instructions. No further questions at time of discharge.    New Prescriptions   No medications on file    Follow Up: No follow-up provider specified.     Woodie Degraffenreid, Canary Brim, MD 01/03/18 [phone]

## 2018-01-02 NOTE — ED Provider Notes (Signed)
[facility] EMERGENCY DEPARTMENT Provider Note   CSN: [id number] Arrival date & time: 01/02/18  4098     History   Chief Complaint No chief complaint on file.   HPI Miranda Griffin is a 62 y.o. female with history of hypertension, hyperlipidemia, paranoid schizophrenia who presents with dysphasia.  Patient reports she had woke up from sleep this morning at 4 am with a sharp pain in her throat and globus sensation.  She felt like it was cutting off her airway.  It resolved without intervention.  She had some shortness of breath related to it.  She reports this is happened before, however she never had difficulty breathing related to it.  She denies any cough or nasal congestion.  She was feeling fine yesterday.  She reports getting tightening in the left side of her neck occasionally related to muscle spasm, however she states this is different.  She is on lisinopril-HCTZ, however has been on this for years.  She denies any edema of her tongue or lips.  She denies any shortness of breath now, chest pain, abdominal pain, nausea, vomiting, urinary symptoms.  She continues to have  a sharp pain in her throat with swallowing.  HPI  Past Medical History:  Diagnosis Date  . Anemia 2008   Now resolved, baseline- 12.5  . Hyperlipidemia   . Hypertension   . Internal hemorrhoids    per colonoscopy 02/2011    Patient Active Problem List   Diagnosis Date Noted  . Breast cyst, left 12/08/2017  . GERD (gastroesophageal reflux disease) 05/13/2017  . Allergic rhinitis 11/15/2016  . Paranoid schizophrenia (HCC)   . Obesity (BMI 30.0-34.9) 11/14/2011  . Internal hemorrhoids   . Preventative health care 12/07/2010  . CONSTIPATION, INTERMITTENT 12/27/2009  . HYPERLIPIDEMIA 03/05/2007  . Essential hypertension 09/25/2006    Past Surgical History:  Procedure Laterality Date  . CESAREAN SECTION     x2, 1989, 1990  . ORIF TIBIA PLATEAU Right 01/2010   with bone graft, plates  and screws     OB History   None      Home Medications    Prior to Admission medications   Medication Sig Start Date End Date Taking? Authorizing Provider  Cetirizine HCl 10 MG TBDP Take 10 mg by mouth daily. 12/08/17   Molt, Bethany, DO  fluticasone (FLONASE) 50 MCG/ACT nasal spray Place 1 spray into both nostrils daily. 11/15/16 11/15/17  Nyra Market, MD  ibuprofen (ADVIL,MOTRIN) 200 MG tablet Take 400 mg by mouth every 6 (six) hours as needed (for pain or headaches).     [provider]  lisinopril-hydrochlorothiazide (PRINZIDE,ZESTORETIC) 20-25 MG tablet Take 1 tablet by mouth daily. 12/08/17   Molt, Bethany, DO  Omeprazole 20 MG TBDD Take 20 mg by mouth every morning. 05/13/17   Nyra Market, MD    Family History Family History  Problem Relation Age of Onset  . Hypertension Father   . Vision loss Father   . Vision loss Paternal Grandmother   . Hypertension Other   . Hyperlipidemia Other     Social History Social History   Tobacco Use  . Smoking status: Never Smoker  . Smokeless tobacco: Never Used  Substance Use Topics  . Alcohol use: No    Comment: occasional wine  . Drug use: No     Allergies   Patient has no known allergies.   Review of Systems Review of Systems  Constitutional: Negative for chills and fever.  HENT: Positive for  sore throat and trouble swallowing. Negative for congestion and facial swelling.   Respiratory: Positive for shortness of breath.   Cardiovascular: Negative for chest pain.  Gastrointestinal: Negative for abdominal pain, nausea and vomiting.  Genitourinary: Negative for dysuria.  Musculoskeletal: Negative for back pain.  Skin: Negative for rash and wound.  Neurological: Negative for headaches.  Psychiatric/Behavioral: The patient is not nervous/anxious.      Physical Exam Updated Vital Signs BP (!) 192/72 (BP Location: Left Arm)   Pulse 60   Temp 98.3 F (36.8 C) (Oral)   Resp 16   Ht  (1.575 m)    Wt 75.8 kg (167 lb)   LMP 12/05/2009   SpO2 100%   BMI 30.54 kg/m   Physical Exam  Constitutional: She appears well-developed and well-nourished. No distress.  HENT:  Head: Normocephalic and atraumatic.  Mouth/Throat: Oropharynx is clear and moist. No oropharyngeal exudate.  Eyes: Pupils are equal, round, and reactive to light. Conjunctivae are normal. Right eye exhibits no discharge. Left eye exhibits no discharge. No scleral icterus.  Neck: Normal range of motion. Neck supple. No thyromegaly present.  No lymphadenopathy or masses palpated on the neck, no tenderness  Cardiovascular: Normal rate, regular rhythm, normal heart sounds and intact distal pulses. Exam reveals no gallop and no friction rub.  No murmur heard. Pulmonary/Chest: Effort normal and breath sounds normal. No stridor. No respiratory distress. She has no wheezes. She has no rales.  Abdominal: Soft. Bowel sounds are normal. She exhibits no distension. There is no tenderness. There is no rebound and no guarding.  Musculoskeletal: She exhibits no edema.  Lymphadenopathy:    She has no cervical adenopathy.  Neurological: She is alert. Coordination normal.  Skin: Skin is warm and dry. No rash noted. She is not diaphoretic. No pallor.  Psychiatric: She has a normal mood and affect.  Nursing note and vitals reviewed.    ED Treatments / Results  Labs (all labs ordered are listed, but only abnormal results are displayed) Labs Reviewed  BASIC METABOLIC PANEL - Abnormal; Notable for the following components:      Result Value   Potassium 5.3 (*)    All other components within normal limits  CBC WITH DIFFERENTIAL/PLATELET    EKG None  Radiology No results found.  Procedures Procedures (including critical care time)  Medications Ordered in ED Medications - No data to display   Initial Impression / Assessment and Plan / ED Course  I have reviewed the triage vital signs and the nursing notes.  Pertinent labs &  imaging results that were available during my care of the patient were reviewed by me and considered in my medical decision making (see chart for details).     Patient presenting with sharp pain in her throat and globus sensation associated with shortness of breath that woke her up this morning. Patient has associated hypertension. She takes lisinopril-HCTZ for hypertension and has for years.  CBC, BMP pending.  Chest x-ray and CT neck soft tissue pending. At shift change, patient care transferred to Dr. Rush Landmark for continued evaluation, follow up of labs, imaging and determination of disposition.  Plan to discontinue lisinopril, initiate a new hypertensive agent, if no other findings and follow-up with PCP.  I discussed patient case with Dr. Hyacinth Meeker and Dr. Rush Landmark who guided the patient's management and agrees with plan.    Final Clinical Impressions(s) / ED Diagnoses   Final diagnoses:  Dysphagia, unspecified type    ED Discharge Orders  None       Emi Holes, [place] 01/02/18 1546    Eber Hong, MD 01/03/18 [phone]

## 2018-01-02 NOTE — Discharge Instructions (Addendum)
Your imaging today showed mild inflammation of your throat however we did not find any evidence of serious infection or abscess.  As you are able to tolerate eating and drinking and had no difficulty breathing, we feel you are safe for discharge home at this time.  Please stay hydrated and follow-up with your primary care physician.  If any symptoms change or worsen, please return to the nearest emergency department.   Please stop taking the lisinopril as this may have been related to your symptoms.

## 2018-01-09 IMAGING — DX DG LUMBAR SPINE COMPLETE 4+V
5 series · 5 of 5 positions shown · non-contrast
Comparison: None.

CLINICAL DATA: Left-sided hip pain, no known injury, initial
encounter

EXAM:
LUMBAR SPINE - COMPLETE 4+ VIEW

[t lumbar spine ap]
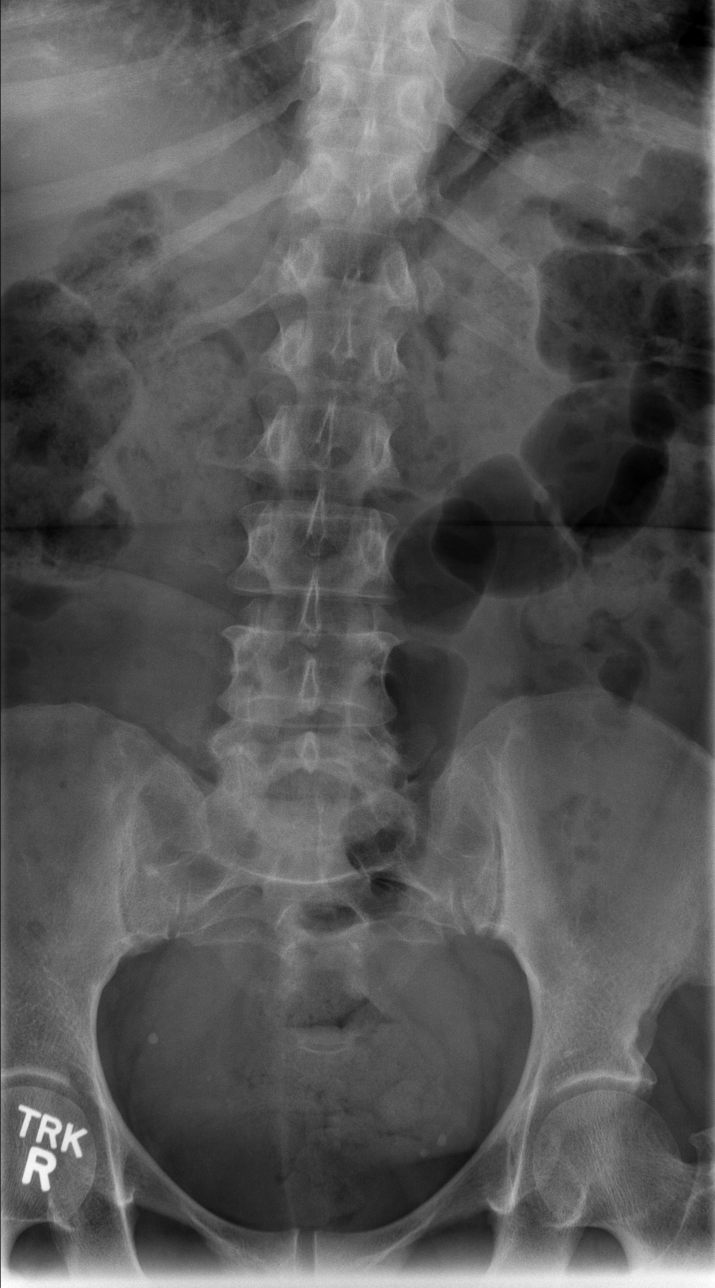

[t lumbar spine obl (1 of 2)]
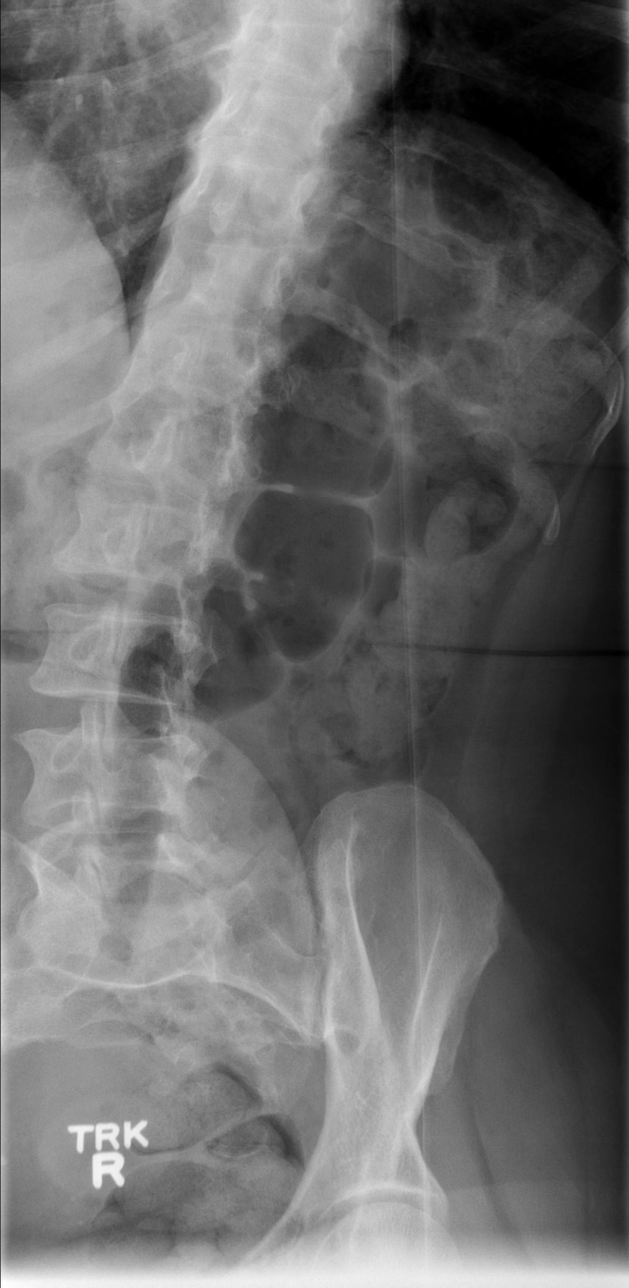

[t lumbar spine obl (2 of 2)]
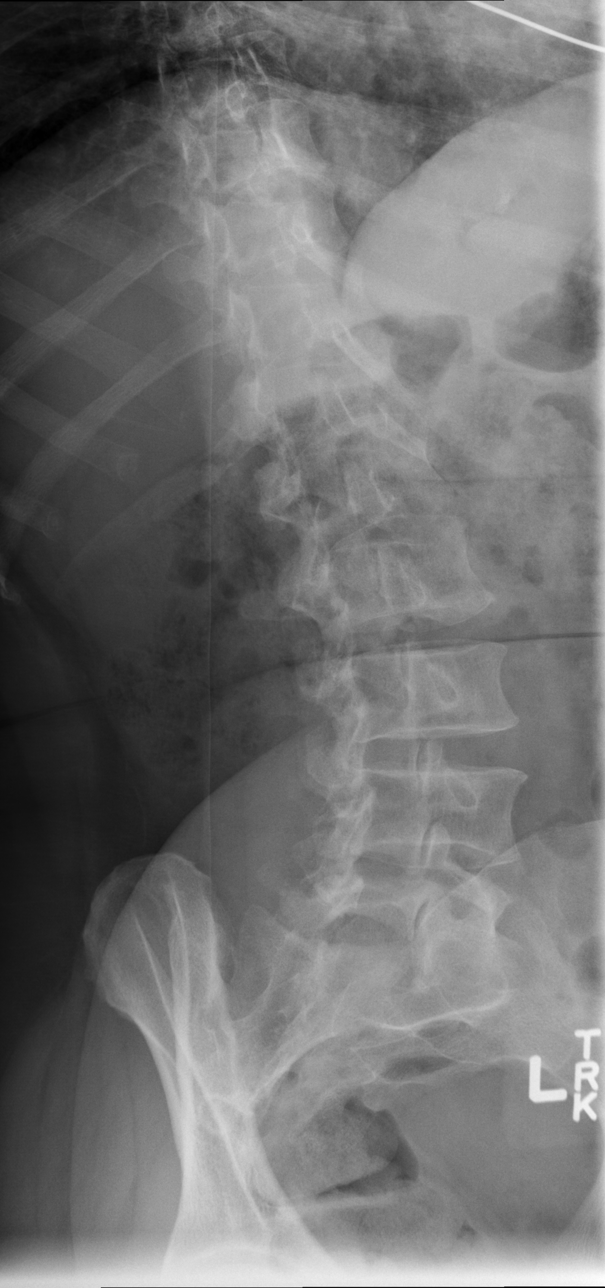

[t lumbar spine lat]
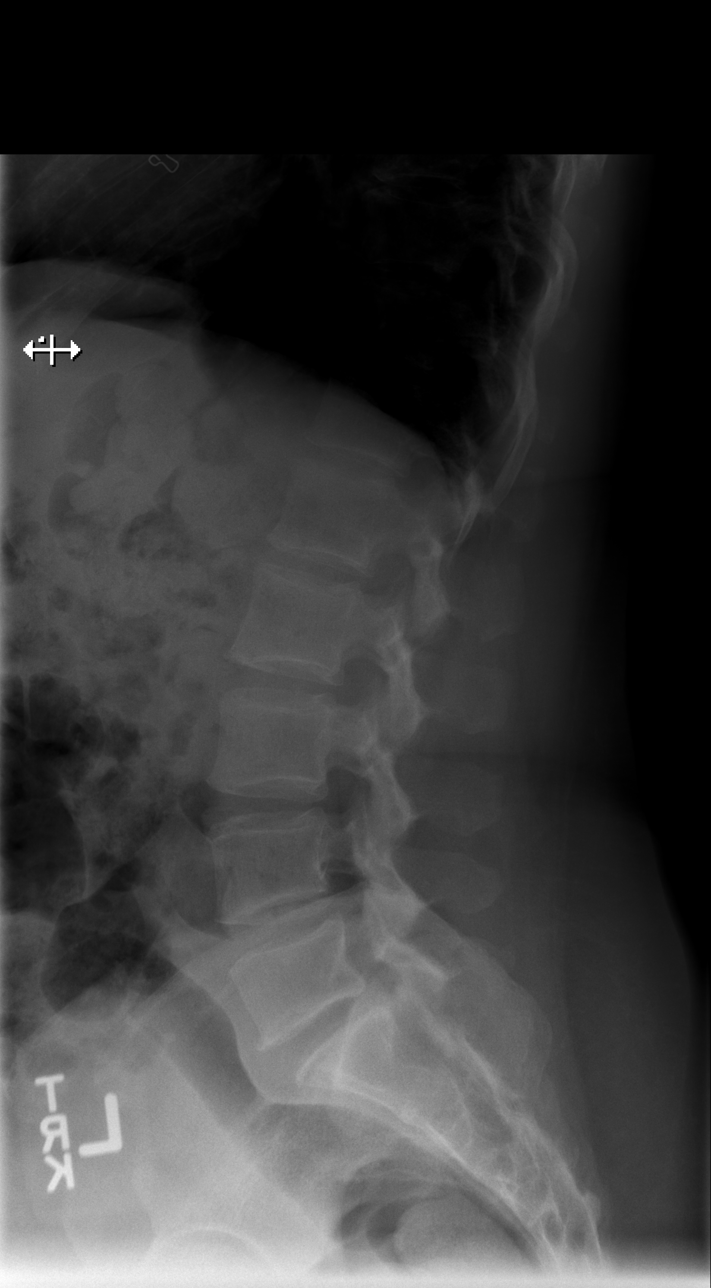

[t lumbar l-5 s-1 spot]
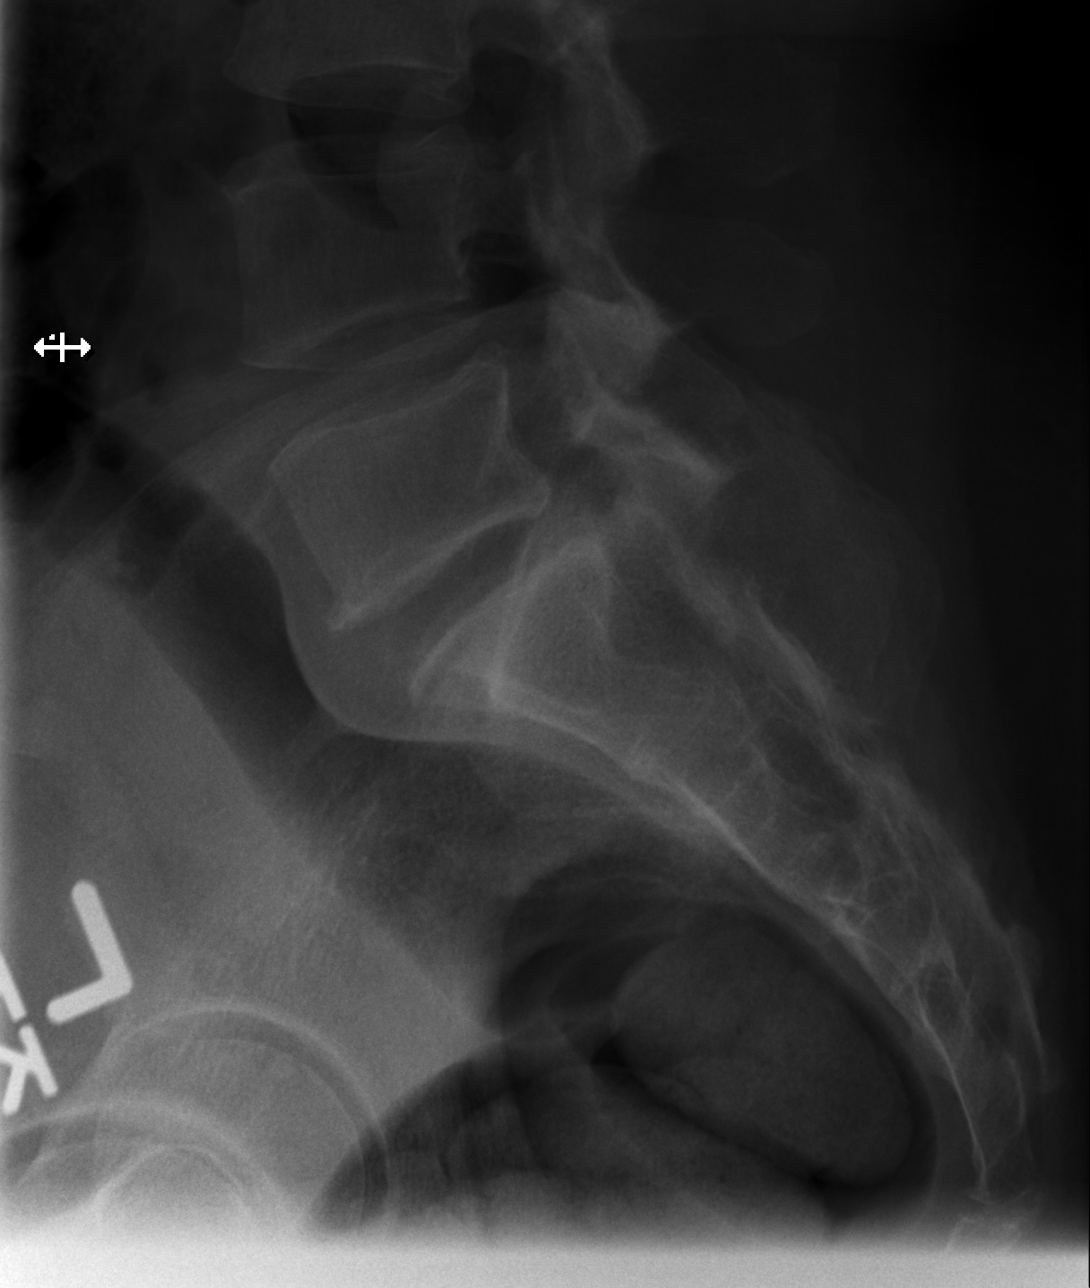

[5 of 5 positions shown; findings below may reference images not displayed]

FINDINGS: Five lumbar type vertebral bodies are well visualized. Vertebral
body height is well maintained. No anterolisthesis is noted. No
significant disc pathology is seen. Very minimal osteophytic changes
are seen. No soft tissue abnormality is noted.
IMPRESSION: Minimal degenerative change without acute abnormality.

## 2018-03-25 ENCOUNTER — Encounter: Payer: Self-pay | Admitting: Internal Medicine

## 2018-07-29 ENCOUNTER — Encounter (HOSPITAL_COMMUNITY): Payer: Self-pay | Admitting: *Deleted

## 2018-07-29 ENCOUNTER — Emergency Department (HOSPITAL_COMMUNITY)
Admission: EM | Admit: 2018-07-29 | Discharge: 2018-07-29 | Disposition: A | Discharge status: Home / Self Care | Payer: Self-pay | Attending: Emergency Medicine | Admitting: Emergency Medicine

## 2018-07-29 DIAGNOSIS — M6289 Other specified disorders of muscle: Secondary | ICD-10-CM | POA: Insufficient documentation

## 2018-07-29 DIAGNOSIS — M79604 Pain in right leg: Secondary | ICD-10-CM | POA: Insufficient documentation

## 2018-07-29 DIAGNOSIS — E785 Hyperlipidemia, unspecified: Secondary | ICD-10-CM | POA: Insufficient documentation

## 2018-07-29 DIAGNOSIS — I10 Essential (primary) hypertension: Secondary | ICD-10-CM | POA: Insufficient documentation

## 2018-07-29 MED ORDER — CYCLOBENZAPRINE HCL 5 MG PO TABS: 5.0000 mg | ORAL_TABLET | Freq: Three times a day (TID) | ORAL | 0 refills | Status: DC | PRN

## 2018-07-29 NOTE — ED Triage Notes (Signed)
Pt in c/o right leg pain, states this has been going on for months, intermittent swelling but denies at this time, states she has coming here for evaluation of this but symptoms have not improved

## 2018-07-29 NOTE — ED Notes (Signed)
Declined W/C at D/C and was escorted to lobby by RN. 

## 2018-07-29 NOTE — ED Provider Notes (Signed)
[facility] Emergency Department Provider Note MRN:  [id number]  Arrival date & time: 07/29/18     Chief Complaint   Leg Pain   History of Present Illness   Miranda Griffin is a 62 y.o. year-old female with a history of schizophrenia presenting to the ED with chief complaint of leg pain.  Endorsing over 2 years of right leg pain.  Patient thinks it is related to the surgery she had on her leg back in 2011.  Pain is slightly increased for the past 2 to 3 months.  Pain is located in the right hamstring, feels tight and gets tighter during the day when walking.  Intermittent pain to the top of the right foot as well.  Denies swelling or redness to the leg, no fevers, no chest pain or shortness of breath.  No trauma.  Review of Systems  A problem-focused ROS was performed. Positive for leg pain.  Patient denies fever.  Patient's Health History    Past Medical History:  Diagnosis Date  . Anemia 2008   Now resolved, baseline- 12.5  . Hyperlipidemia   . Hypertension   . Internal hemorrhoids    per colonoscopy 02/2011    Past Surgical History:  Procedure Laterality Date  . CESAREAN SECTION     x2, 1989, 1990  . ORIF TIBIA PLATEAU Right 01/2010   with bone graft, plates and screws    Family History  Problem Relation Age of Onset  . Hypertension Father   . Vision loss Father   . Vision loss Paternal Grandmother   . Hypertension Other   . Hyperlipidemia Other     Social History   Socioeconomic History  . Marital status: Divorced    Spouse name: Not on file  . Number of children: Not on file  . Years of education: Not on file  . Highest education level: Not on file  Occupational History  . Not on file  Social Needs  . Financial resource strain: Not on file  . Food insecurity:    Worry: Not on file    Inability: Not on file  . Transportation needs:    Medical: Not on file    Non-medical: Not on file  Tobacco Use  . Smoking status: Never Smoker  .  Smokeless tobacco: Never Used  Substance and Sexual Activity  . Alcohol use: No    Comment: occasional wine  . Drug use: No  . Sexual activity: Yes    Partners: Male  Lifestyle  . Physical activity:    Days per week: Not on file    Minutes per session: Not on file  . Stress: Not on file  Relationships  . Social connections:    Talks on phone: Not on file    Gets together: Not on file    Attends religious service: Not on file    Active member of club or organization: Not on file    Attends meetings of clubs or organizations: Not on file    Relationship status: Not on file  . Intimate partner violence:    Fear of current or ex partner: Not on file    Emotionally abused: Not on file    Physically abused: Not on file    Forced sexual activity: Not on file  Other Topics Concern  . 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 Originally from [place] Moved to [place] in [place].      Physical Exam  Vital Signs and Nursing Notes reviewed Vitals:   07/29/18 1353 07/29/18 1600  BP: (!) 208/112 (!) 220/100  Pulse: 84   Resp: 20   Temp: 98 F (36.7 C)   SpO2: 100%     CONSTITUTIONAL: Well-appearing, NAD NEURO:  Alert and oriented x 3, no focal deficits EYES:  eyes equal and reactive ENT/NECK:  no LAD, no JVD CARDIO: Regular rate, well-perfused, normal S1 and S2 PULM:  CTAB no wheezing or rhonchi GI/GU:  normal bowel sounds, non-distended, non-tender MSK/SPINE:  No gross deformities, no edema, no erythema, largely no tenderness to palpation to the right leg, normal range of motion of all joints SKIN:  no rash, atraumatic PSYCH:  Appropriate speech and behavior  Diagnostic and Interventional Summary    Labs Reviewed - No data to display  No orders to display    Medications - No data to display   Procedures Critical Care  ED Course and Medical  Decision Making  I have reviewed the triage vital signs and the nursing notes.  Pertinent labs & imaging results that were available during my care of the patient were reviewed by me and considered in my medical decision making (see below for details).  Unclear etiology of patient's chronic leg pain, thought to be muscle strain.  Pain is not located in patient's prior surgical site, which was the tibial plateau.  No recent trauma, nothing to suggest an indication for x-ray imaging today.  Knee is not warm, not erythematous, normal range of motion, nothing to suggest septic joint.  There is no increased warmth or swelling to the right leg to suggest DVT.  Provided reassurance, advised stretching exercises, prescription for muscle relaxer.  Patient has follow-up with her primary care doctor in the coming weeks.  Patient is here with asymptomatic hypertension as well, has not taken her antihypertensive medications today.  Advised to do so.  After the discussed management above, the patient was determined to be safe for discharge.  The patient was in agreement with this plan and all questions regarding their care were answered.  ED return precautions were discussed and the patient will return to the ED with any significant worsening of condition.   Elmer SowMichael M. Pilar PlateBero, MD [facility] [email or web address]  Final Clinical Impressions(s) / ED Diagnoses     ICD-10-CM   1. Muscle tightness M62.89   2. Right leg pain M79.604     ED Discharge Orders         Ordered    cyclobenzaprine (FLEXERIL) 5 MG tablet  3 times daily PRN     07/29/18 1632             Sabas SousBero, Michael M, MD 07/29/18 [phone]

## 2018-07-29 NOTE — Discharge Instructions (Addendum)
You were evaluated in the Emergency Department and after careful evaluation, we did not find any emergent condition requiring admission or further testing in the hospital.  Your symptoms today seem to be due to muscle tightness.  Stretching of the hamstrings throughout the day would likely be beneficial.  You could also try the medication provided.  Do not drive or operate heavy machinery while taking the medication provided.  Please return to the Emergency Department if you experience any worsening of your condition.  We encourage you to follow up with a primary care provider.  Thank you for allowing us to be a part of your care.

## 2018-09-07 ENCOUNTER — Encounter: Payer: Self-pay | Admitting: Internal Medicine

## 2018-09-09 ENCOUNTER — Other Ambulatory Visit (HOSPITAL_COMMUNITY)
Admission: RE | Admit: 2018-09-09 | Discharge: 2018-09-09 | Disposition: A | Discharge status: Home / Self Care | Payer: Commercial Managed Care - PPO | Source: Ambulatory Visit | Attending: Internal Medicine | Admitting: Internal Medicine

## 2018-09-09 ENCOUNTER — Other Ambulatory Visit: Payer: Self-pay

## 2018-09-09 ENCOUNTER — Encounter: Payer: Self-pay | Admitting: Internal Medicine

## 2018-09-09 ENCOUNTER — Ambulatory Visit: Payer: Commercial Managed Care - PPO | Admitting: Licensed Clinical Social Worker

## 2018-09-09 ENCOUNTER — Encounter: Payer: Self-pay | Admitting: Licensed Clinical Social Worker

## 2018-09-09 ENCOUNTER — Ambulatory Visit (INDEPENDENT_AMBULATORY_CARE_PROVIDER_SITE_OTHER): Payer: Commercial Managed Care - PPO | Admitting: Internal Medicine

## 2018-09-09 VITALS — BP 185/89 | HR 72 | Temp 98.0°F | Ht 62.0 in | Wt 187.8 lb

## 2018-09-09 DIAGNOSIS — Z124 Encounter for screening for malignant neoplasm of cervix: Secondary | ICD-10-CM

## 2018-09-09 DIAGNOSIS — M25552 Pain in left hip: Secondary | ICD-10-CM

## 2018-09-09 DIAGNOSIS — I10 Essential (primary) hypertension: Secondary | ICD-10-CM

## 2018-09-09 DIAGNOSIS — M25561 Pain in right knee: Secondary | ICD-10-CM | POA: Diagnosis not present

## 2018-09-09 DIAGNOSIS — Z23 Encounter for immunization: Secondary | ICD-10-CM | POA: Diagnosis not present

## 2018-09-09 DIAGNOSIS — G8921 Chronic pain due to trauma: Secondary | ICD-10-CM

## 2018-09-09 DIAGNOSIS — F200 Paranoid schizophrenia: Secondary | ICD-10-CM

## 2018-09-09 DIAGNOSIS — Z0000 Encounter for general adult medical examination without abnormal findings: Secondary | ICD-10-CM

## 2018-09-09 DIAGNOSIS — G8929 Other chronic pain: Secondary | ICD-10-CM

## 2018-09-09 DIAGNOSIS — M25551 Pain in right hip: Secondary | ICD-10-CM

## 2018-09-09 MED ORDER — AMLODIPINE BESYLATE 5 MG PO TABS: 5.0000 mg | ORAL_TABLET | Freq: Every day | ORAL | 1 refills | Status: DC

## 2018-09-09 MED ORDER — CYCLOBENZAPRINE HCL 5 MG PO TABS: 5.0000 mg | ORAL_TABLET | Freq: Three times a day (TID) | ORAL | 0 refills | Status: DC | PRN

## 2018-09-09 MED ORDER — CHLORTHALIDONE 50 MG PO TABS: 50.0000 mg | ORAL_TABLET | Freq: Every day | ORAL | 1 refills | Status: DC

## 2018-09-09 MED ORDER — CYCLOBENZAPRINE HCL 5 MG PO TABS: 5.0000 mg | ORAL_TABLET | Freq: Three times a day (TID) | ORAL | 0 refills | Status: AC | PRN

## 2018-09-09 NOTE — Progress Notes (Signed)
   CC: hypertension  HPI:  [name] is a 63 y.o. with a PMH of hypertension, paranoid schizophrenia presenting to clinic for follow up on hypertension.  HTN: Patient previously on lisinopril and previous attempt to add on HCTZ. She states she never received a script, therefore has been off of meds for months. She denies chest pain, shortness of breath, headaches, vision or hearing changes.  Right knee pain: Patient with previous knee surgery due to trauma. She endorses chronic right knee pain, not usually associated with swelling. She has started a new job since her last visit and is now working full time as Engineer, materials at [employer]. She reports that after about 4 hours of work, her knee pain really starts to bother her and she start having some bil hip pain as well. She has not had improvement with ibuprofen; she did find flexeril helpful.  Please see problem based Assessment and Plan for status of patients chronic conditions.  Past Medical History:  Diagnosis Date  . Anemia 2008   Now resolved, baseline- 12.5  . Hyperlipidemia   . Hypertension   . Internal hemorrhoids    per colonoscopy 02/2011    Review of Systems:   Per HPI  Physical Exam:  Vitals:   09/09/18 1315  BP: (!) 185/89  Pulse: 72  Temp: 98 F (36.7 C)  TempSrc: Oral  SpO2: 100%  Weight: 187 lb 12.8 oz (85.2 kg)  Height: 5\' 2"  (1.575 m)   GENERAL- alert, co-operative, appears as stated age, not in any distress. CARDIAC- RRR, no murmurs, rubs or gallops. RESP- Moving equal volumes of air, and clear to auscultation bilaterally, no wheezes or crackles. ABDOMEN- Soft, nontender, bowel sounds present. NEURO- CN 2-12 grossly intact. Strength intact in Bil LEs. EXTREMITIES- pulse 2+ PT/DP, symmetric, no pedal edema. R knee with vertical surgical scar, no effusion, no increased warmth; TTP of lateral tibial platau SKIN- Warm, dry, no rash or lesion. PSYCH- Normal mood and affect, appropriate thought  content and speech.  Assessment & Plan:   See Encounters Tab for problem based charting.   Patient discussed with Dr. Valla Leaver, MD Internal Medicine PGY-3

## 2018-09-09 NOTE — BH Specialist Note (Signed)
Integrated Behavioral Health Initial Visit  MRN: [id number] Name: Miranda Griffin  Number of Integrated Behavioral Health Clinician visits:: 1/6 Session Start time: 2:05  Session End time: 2:35 Total time: 30 minutes  Type of Service: Integrated Behavioral Health- Individual/Family Interpretor:No. I   Warm Hand Off Completed. Yes, by Dr. Samuella Cota.       SUBJECTIVE: Miranda Griffin is a 63 y.o. female  whom attended the session individually.  Patient was referred by Dr. Samuella Cota for . Patient reports the following symptoms/concerns: Patient is concerned about the static noise and voices from "Americans" that she hears in her head.  Duration of problem: ongoing since 2014; Severity of problem: moderate  OBJECTIVE: Mood: Paranoid and Affect: Blunt Risk of harm to self or others: No plan to harm self or others  LIFE CONTEXT: Family and Social: Patient is living with her "landlord". Patient reported she is currently renting a room from her landlord, and has been living with her for one year. Patient has two adult children, and reported that she has not seen them in over two years.  Self-Care: Patient discussed that she is unable to walk as much as she would like due to a surgery she had on her leg in 2016.  Life Changes: None recent events reported.   GOALS ADDRESSED: Patient will: 1. Reduce symptoms of: paranoid thoughts and beliefs.  2. Increase knowledge and/or ability of: healthy habits and challenge irrational thought patterns.   3. Demonstrate ability to: Increase adequate support systems for patient/family, Increase motivation to adhere to plan of care and address psychiatric realted issues that are causing distress.   INTERVENTIONS: Interventions utilized: Supportive Counseling and Link to [facility]. Therapist assessed for SI, HI, and self-harm. The PHQ-9 results were reviewed with the patient. Therapist offered linking the patient to a local psychiatrist to provide  support. Therapist supported the patient as she shared her concerns with the voices and static noises in her head. Therapist assessed to see if the patient communicates with the voices.  Standardized Assessments completed: PHQ 9  ASSESSMENT: Patient currently experiencing auditory hallucinations, delusional thinking patterns, and paranoia. Patient reported that she is not interested in talking to a psychiatrist or taking medication because the injections she has received in the past, her body rejected (reported swelling in her arm at site of the injection). Patient has been hearing static noises since 2014. The patient reported that some days she is unable to engage in activities that she wants to do if the static noise is too loud. Patient reported "I am not depressed". Patient is denying any thoughts of hurting herself or others. Patient also discussed that she hears voices of "Americans". Patient is unsure why they are talking to her because she does not even understand what they are trying to tell her. Patient has tried re-locating to get away from the voices, but the voices have followed her. Patient reported that sometimes she engages in conversations with the "Americans", but is very unclear about the message they are trying to send her. Patient believes that the static and "Americans" communicating to her are all due to the government implanting a device in her leg when she had her surgery in 2016.    Patient may benefit from weekly outpatient therapy, and being closely monitored by a psychiatrist.   PLAN: 1. Follow up with behavioral health clinician on : one week. Patient is declining any therapy or appointments with a psychiatrist.   Lysle Rubens, LPC, LCAS

## 2018-09-09 NOTE — Patient Instructions (Signed)
Start taking amlodipine 5mg  daily and chlorthalidone 50mg  daily. I'll send a referral to physical therapy for your knee, and you can keep taking flexeril upt to twice a day as needed as well as tylenol 500mg  every 8 hours as needed.   We'll see you in 2 weeks to check on your blood pressure.

## 2018-09-10 ENCOUNTER — Encounter: Payer: Self-pay | Admitting: Internal Medicine

## 2018-09-10 DIAGNOSIS — M25561 Pain in right knee: Principal | ICD-10-CM

## 2018-09-10 DIAGNOSIS — G8929 Other chronic pain: Secondary | ICD-10-CM | POA: Insufficient documentation

## 2018-09-10 NOTE — Assessment & Plan Note (Signed)
Untreated. Today she endorses that she has been able to start working and is not up to full time hours. We were also able to discuss her blood pressure management appropriately and preventative measures which has not been the case at previous visits. She has not followed up with psychiatry and has no plans to do so; she feels the couple of hospitalizations she has had have cleansed her. She is willing to meet with in-office BH.  Plan: --referral to integrated [facility]

## 2018-09-10 NOTE — Assessment & Plan Note (Signed)
Uncontrolled and asymptomatic.  Plan: --start amlodipine 5mg  daily and chlorthalidone 50mg  daily --f/u in 2 wks for BP check and Bmet

## 2018-09-10 NOTE — Assessment & Plan Note (Addendum)
Pap smear performed today. Flu vaccine administered today.  At f/u visits, will work on breast cancer screening, HCV screen.  **Addendum: Pap with negative cytology but HR HPV positive (negative for 16/18); per guidelines, recommendation is to repeat pap with co-testing in 1 year

## 2018-09-10 NOTE — Assessment & Plan Note (Signed)
Patient s/p ORIF of right tibial plateau now with months h/o of right knee pain especially after being on her feet for extended periods of time. She is still able to work. No improvement with ibuprofen; some improvement with flexeril.  Exam w/o effusion, or increased warmth.  Plan: --refill flexeril --referral to PT

## 2018-09-11 NOTE — Progress Notes (Signed)
Internal Medicine Clinic Attending  Case discussed with Dr. Svalina  at the time of the visit.  We reviewed the resident's history and exam and pertinent patient test results.  I agree with the assessment, diagnosis, and plan of care documented in the resident's note.  

## 2018-09-15 ENCOUNTER — Telehealth: Payer: Self-pay | Admitting: Licensed Clinical Social Worker

## 2018-09-15 LAB — CYTOLOGY - PAP
Diagnosis: NEGATIVE
HPV 16/18/45 GENOTYPING: NEGATIVE
HPV: DETECTED — AB

## 2018-09-15 NOTE — Telephone Encounter (Signed)
Patient was contacted to follow up on a referral. Patient wanted to schedule the same day that she was already in the office. I will not be in the office that day, and offered her to come in a different day. Patient reported that due to her work schedule she was unsure at this time. Patient will be contacted in the next few weeks to follow back up with to see if she would like to schedule an appointment.

## 2018-09-16 ENCOUNTER — Other Ambulatory Visit: Payer: Self-pay | Admitting: Internal Medicine

## 2018-09-25 ENCOUNTER — Ambulatory Visit (INDEPENDENT_AMBULATORY_CARE_PROVIDER_SITE_OTHER): Payer: Commercial Managed Care - PPO | Admitting: Internal Medicine

## 2018-09-25 ENCOUNTER — Other Ambulatory Visit: Payer: Self-pay

## 2018-09-25 VITALS — BP 153/81 | HR 73 | Temp 98.1°F | Ht 62.0 in | Wt 189.8 lb

## 2018-09-25 DIAGNOSIS — I10 Essential (primary) hypertension: Secondary | ICD-10-CM | POA: Diagnosis not present

## 2018-09-25 DIAGNOSIS — M25561 Pain in right knee: Secondary | ICD-10-CM

## 2018-09-25 DIAGNOSIS — G8929 Other chronic pain: Secondary | ICD-10-CM

## 2018-09-25 DIAGNOSIS — Z79899 Other long term (current) drug therapy: Secondary | ICD-10-CM | POA: Diagnosis not present

## 2018-09-25 MED ORDER — AMLODIPINE BESYLATE 10 MG PO TABS: 10.0000 mg | ORAL_TABLET | Freq: Every day | ORAL | 1 refills | Status: AC

## 2018-09-25 NOTE — Assessment & Plan Note (Signed)
  Right knee pain and leg heaviness: I do not see nor can I find an notable abnormality of the right leg, hip, ankle, or indwelling musculature.  I feel that there is a minimal degree of deconditioning in conjunction with her prior proximal tibial injury and feel that physical therapy will most benefit her.

## 2018-09-25 NOTE — Assessment & Plan Note (Signed)
  HTN: The patient demonstrates a BP of 142/78 today. She denied acute issues and endorses close adherence to her BP medications.  She denies dizziness, nausea, headache, visual changes, chest pain, palpitations, or weakness.  Plan: Continue chlorthalidone 50mg  daily Increase Amlodipine to 10mg  daily

## 2018-09-25 NOTE — Patient Instructions (Signed)
FOLLOW-UP INSTRUCTIONS When: 2-3 months with Dr. Samuella Cota What to bring: All of your medications  I have increased your Amlodipine today to 10mg  daily.  Today we discussed your high blood pressure and right leg pain/heaviness. Please complete the physical therapy and let us know if this does not help.  Thank you for your visit to the [facility] today. If you have any questions or concerns please call us at [phone].

## 2018-09-25 NOTE — Progress Notes (Addendum)
   CC: HTN  HPI:Miranda Griffin is a 63 y.o. female who presents for evaluation of HTN. Please see individual problem based A/P for details.  PHQ-9: Based on the patients    Office Visit from 09/25/2018 in [facility]  PHQ-9 Total Score  5     score we have decided to monitor.  Past Medical History:  Diagnosis Date  . Anemia 2008   Now resolved, baseline- 12.5  . Hyperlipidemia   . Hypertension   . Internal hemorrhoids    per colonoscopy 02/2011   Review of Systems: ROS negative except as per HPI.  Physical Exam: Vitals:   09/25/18 1337 09/25/18 1404  BP: (!) 142/78 (!) 153/81  Pulse: 73   Temp: 98.1 F (36.7 C)   TempSrc: Oral   SpO2: 100%   Weight: 189 lb 12.8 oz (86.1 kg)   Height: 5\' 2"  (1.575 m)    General: A/O x4, in no acute distress, afebrile, nondiaphoretic Cardio: RRR, no mrg's Pulmonary: CTA bilaterally MSK: BLE nontender, nonedematous, strength is 5/5 bilateral lower extremities and equal, sensation is intact bilateral distal extremities, pulses are intact, there is a elongated scar overlying the right knee, no focal tenderness, no appreciable abnormality  Assessment & Plan:   See Encounters Tab for problem based charting.  Patient discussed with Dr. Cyndie Chime   Medicine attending: Medical history, presenting problems, physical findings, and medications, reviewed with resident physician Dr Lanelle Bal on the day of the patient visit and I concur with his evaluation and management plan.

## 2018-09-25 NOTE — Progress Notes (Signed)
Void  

## 2018-09-26 LAB — BMP8+ANION GAP
ANION GAP: 20 mmol/L — AB (ref 10.0–18.0)
BUN/Creatinine Ratio: 15 (ref 12–28)
BUN: 16 mg/dL (ref 8–27)
CO2: 25 mmol/L (ref 20–29)
CREATININE: 1.09 mg/dL — AB (ref 0.57–1.00)
Calcium: 10 mg/dL (ref 8.7–10.3)
Chloride: 98 mmol/L (ref 96–106)
GFR calc Af Amer: 63 mL/min/{1.73_m2} (ref >59)
GFR, EST NON AFRICAN AMERICAN: 55 mL/min/{1.73_m2} — AB (ref >59)
Glucose: 82 mg/dL (ref 65–99)
Potassium: 3.8 mmol/L (ref 3.5–5.2)
Sodium: 143 mmol/L (ref 134–144)

## 2018-09-30 ENCOUNTER — Telehealth: Payer: Self-pay | Admitting: Internal Medicine

## 2018-09-30 NOTE — Telephone Encounter (Signed)
Notified patient of her BMP results. I would recommend repeating the BMP at her next visit in March as there was a slight increase in Scr but not significantly so. She continued to deny symptoms but has failed to remain hydrated which may explain the Scr.   Lanelle Bal, MD [facility] Internal Medicine, PGY-2

## 2018-11-03 ENCOUNTER — Encounter: Payer: Self-pay | Admitting: Licensed Clinical Social Worker

## 2018-11-03 ENCOUNTER — Telehealth: Payer: Self-pay | Admitting: Licensed Clinical Social Worker

## 2018-11-03 NOTE — Telephone Encounter (Signed)
Patient was contacted to schedule an appointment through [facility] given referral from her PCP. Patient could not be contacted and no voicemail option was available. A letter will be mailed for further contact.   -Ortencia Kick, Behavioral Health Intern

## 2018-11-18 ENCOUNTER — Encounter: Payer: Self-pay | Admitting: Internal Medicine

## 2019-01-24 ENCOUNTER — Encounter: Payer: Self-pay | Admitting: *Deleted

## 2019-04-13 IMAGING — CT CT NECK W/ CM
4 series · 15 of 33 positions shown, 18 images · IV contrast (APPLIED)
Comparison: Head CT without contrast 06/27/2014.

CLINICAL DATA: 61-year-old female with recent onset sore throat.
Left side neck pain, pain with swallowing.

EXAM:
CT NECK WITH CONTRAST
TECHNIQUE: Multidetector CT imaging of the neck was performed using the
standard protocol following the bolus administration of intravenous
contrast.
CONTRAST:  100mL OMNIPAQUE IOHEXOL 300 MG/ML  SOLN

[Series 3: neck 2.0 i31s 3 · axial · 0.34mm/px · z∈[-224,-188]mm · 2 of 107 slices shown]
[im 18/107  bone]
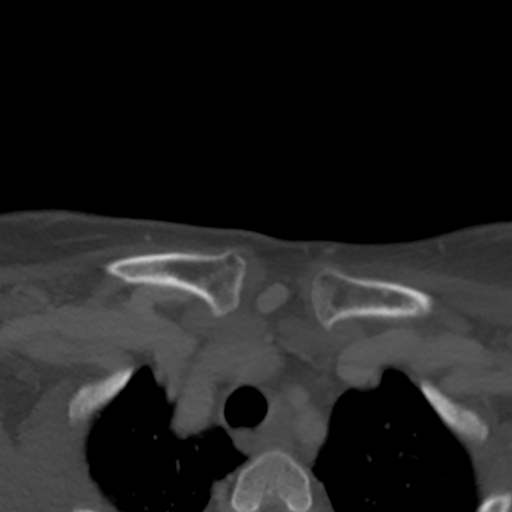
[im 36/107  bone]
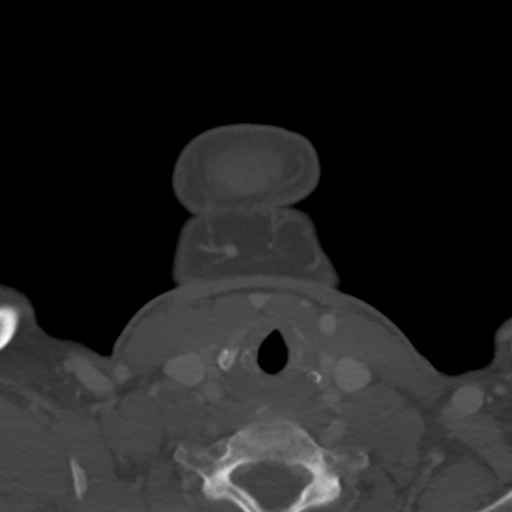

[Series 6: coronal st · coronal · 0.40mm/px · 3 of 100 slices shown]
[im 20/100  bone]
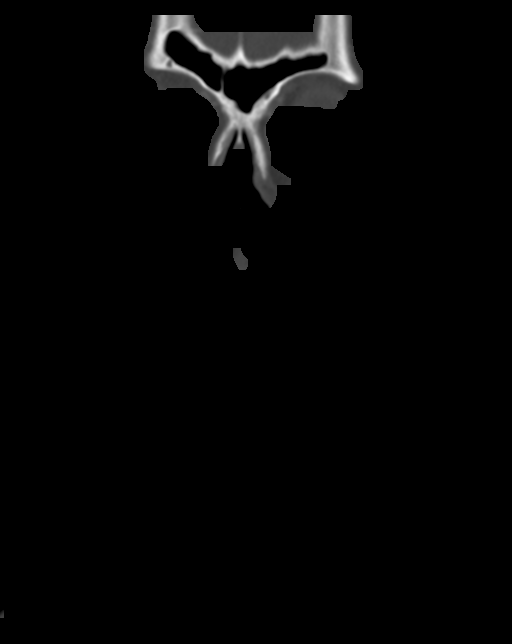
[im 40/100  bone]
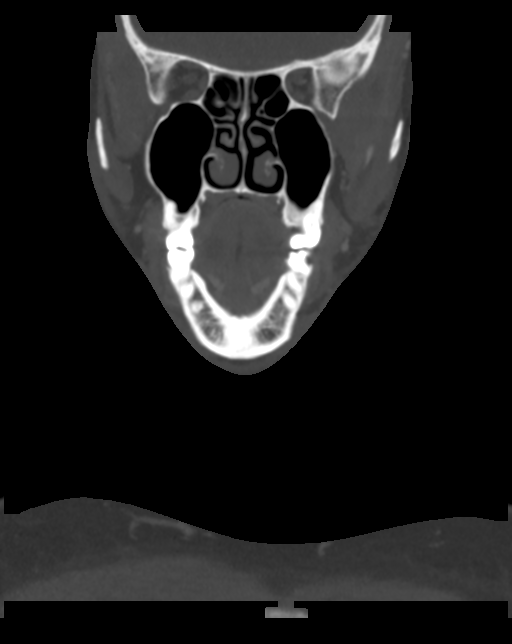
[im 60/100  bone]
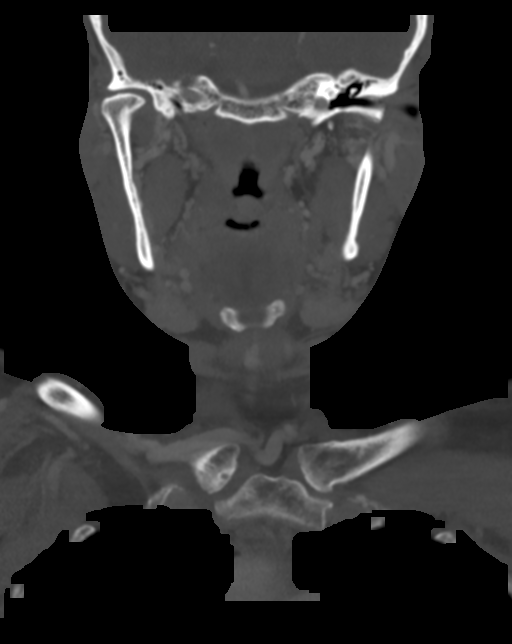

[Series 7: sagittal st · sagittal · 0.45mm/px · 5 of 101 slices shown, 6 images]
[im 34/101  bone]
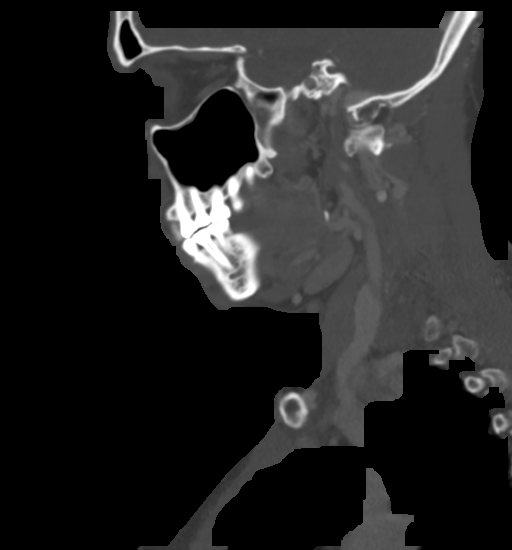
[im 42/101  bone]
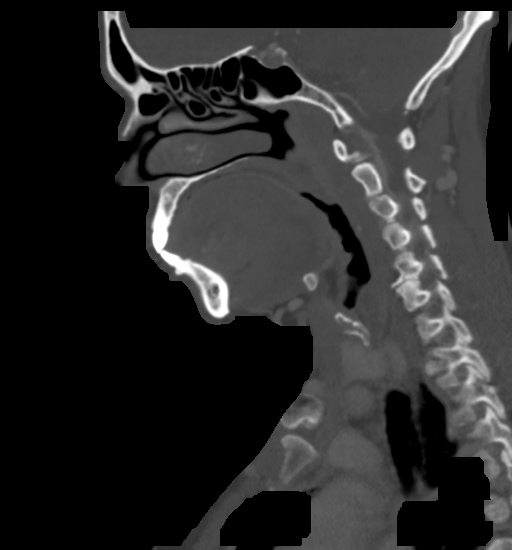
[im 51/101  soft-tissue]
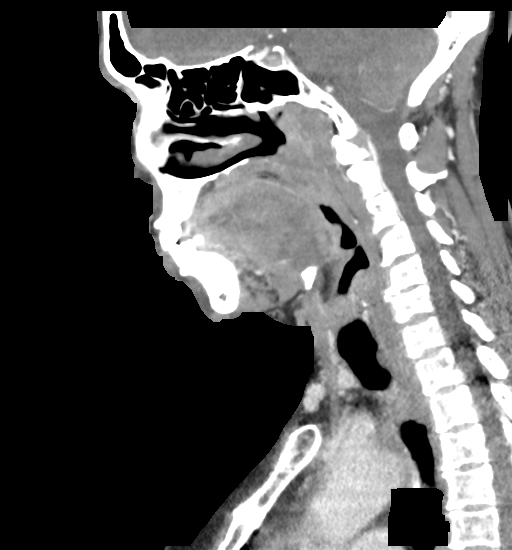
[im 51/101  bone]
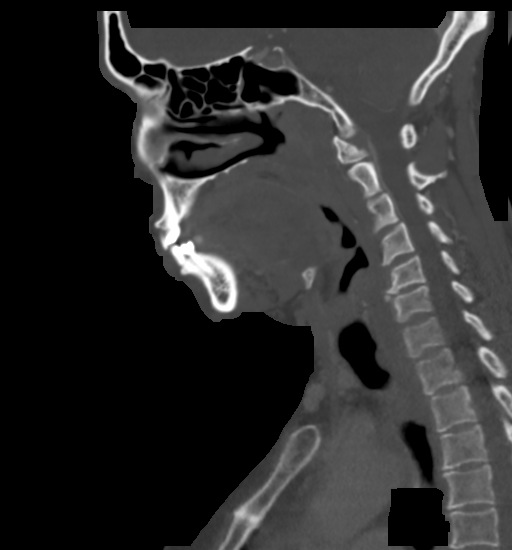
[im 59/101  bone]
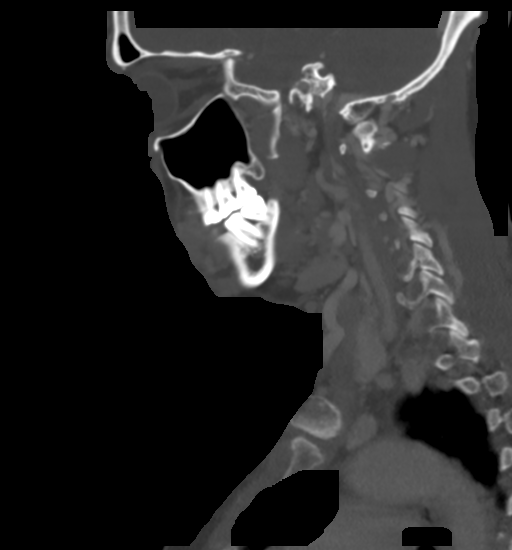
[im 67/101  bone]
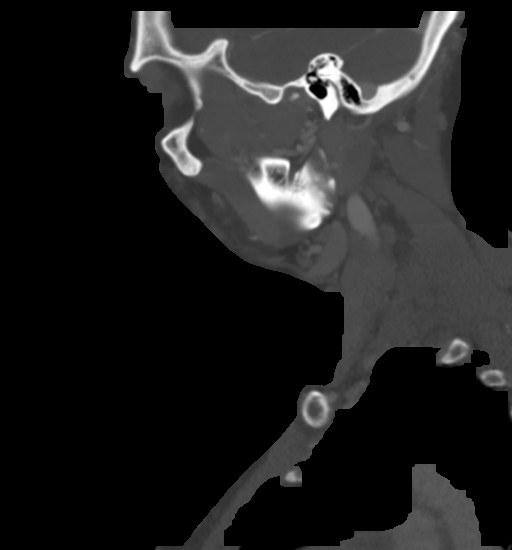

[Series 8: orthogonal st · axial · 0.39mm/px · z∈[-236,-84]mm · 5 of 115 slices shown, 7 images]
[im 20/115  soft-tissue]
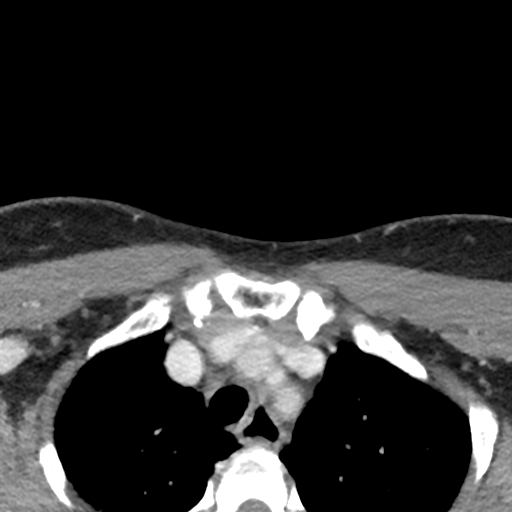
[im 20/115  bone]
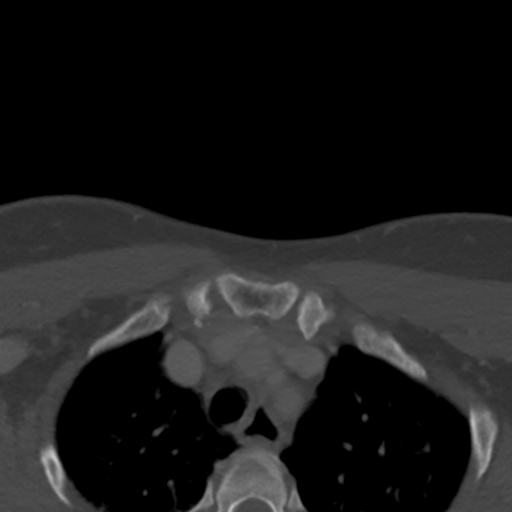
[im 39/115  bone]
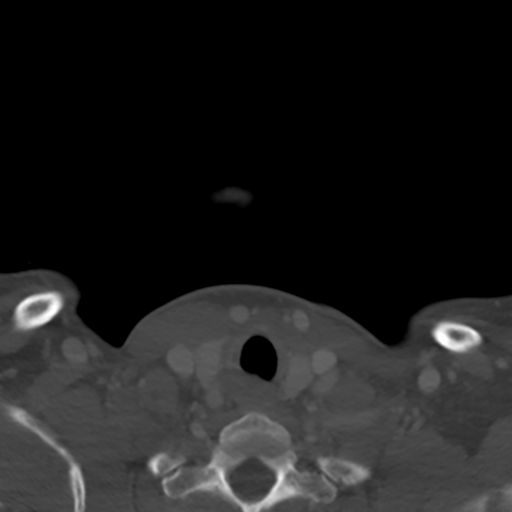
[im 58/115  bone]
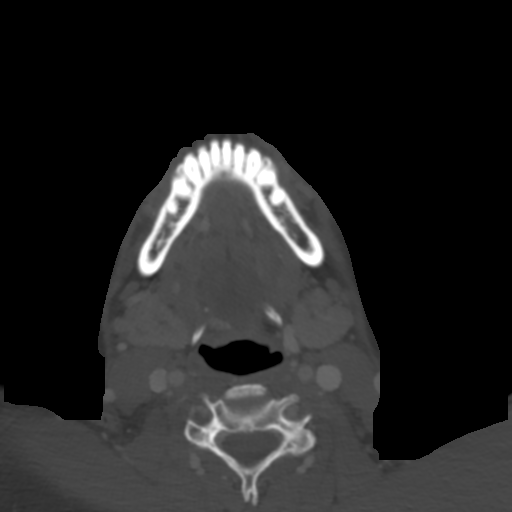
[im 77/115  bone]
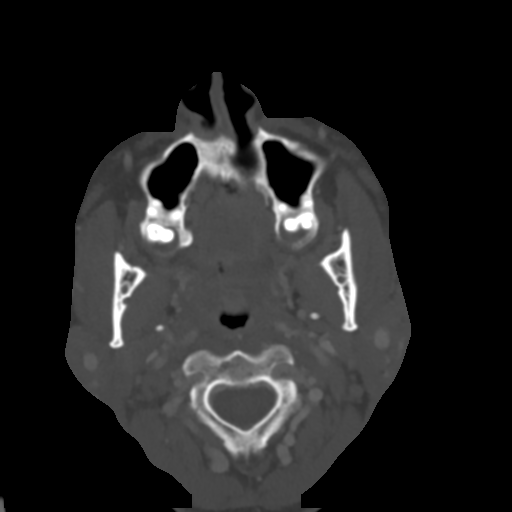
[im 96/115  soft-tissue]
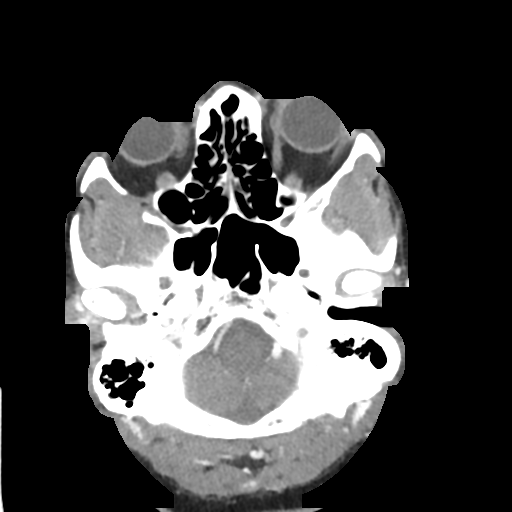
[im 96/115  bone]
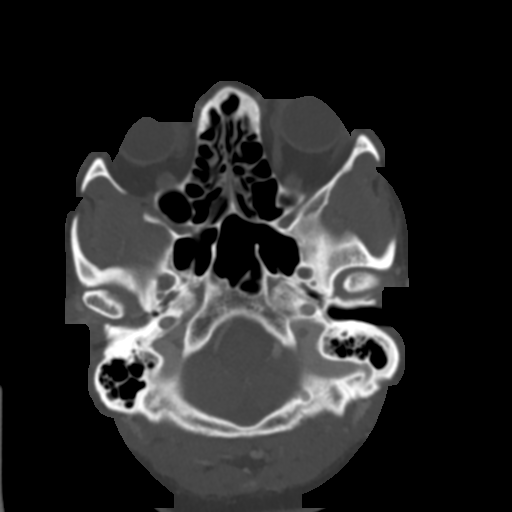

[15 of 33 positions shown; findings below may reference images not displayed]

FINDINGS: Pharynx and larynx: Negative larynx. The epiglottis is normal. The
pharyngeal soft tissue contours are within normal limits. There does
appear to be intermittent generalized pharyngeal mucosal space
hyperenhancement (e.g. Nasopharynx series 3, image 29). Negative
parapharyngeal and retropharyngeal spaces.

Salivary glands: Negative sublingual space. The submandibular glands
appear symmetric and within normal limits. The parotid glands appear
symmetric and within normal limits.

Thyroid: Negative.

Lymph nodes: No cervical lymphadenopathy.

Vascular: The major vascular structures in the neck and at the skull
base are patent, including both internal jugular veins.

Limited intracranial: Negative visible brain parenchyma.

Visualized orbits: Postoperative changes to both globes, otherwise
negative.

Mastoids and visualized paranasal sinuses: Clear. Bilateral tympanic
cavities are clear.

Skeleton: No acute osseous abnormality identified. Mild for age
cervical spine degeneration.

Upper chest: Negative visible lung apices; there is a benign
appearing 16 mm pneumatocyst the left upper lobe on series 4, image
97. No superior mediastinal lymphadenopathy.
IMPRESSION: Negative neck CT aside from suggestion of widespread pharyngeal
mucosal hyper-enhancement, such as due to acute mucositis.

## 2019-07-30 ENCOUNTER — Other Ambulatory Visit: Payer: Self-pay | Admitting: *Deleted

## 2019-07-30 DIAGNOSIS — I10 Essential (primary) hypertension: Secondary | ICD-10-CM

## 2019-08-01 MED ORDER — CHLORTHALIDONE 50 MG PO TABS: 50.0000 mg | ORAL_TABLET | Freq: Every day | ORAL | 1 refills | Status: AC

## 2019-08-02 NOTE — Telephone Encounter (Signed)
Spoke with the pt.  She has been sch an appt for 09/29/2019 @ 1:15pm with her PCP.

## 2019-09-29 ENCOUNTER — Encounter: Payer: Commercial Managed Care - PPO | Admitting: Internal Medicine

## 2020-09-28 ENCOUNTER — Encounter: Payer: Self-pay | Admitting: Internal Medicine
